# Patient Record
Sex: Female | Born: 1965 | Race: White | Hispanic: No | Marital: Married | Smoking: Never smoker
Health system: Southern US, Community
[De-identification: ages and names within clinical notes are randomized; demographics above are authoritative.]

## PROBLEM LIST (undated history)

## (undated) DIAGNOSIS — Z789 Other specified health status: Secondary | ICD-10-CM

## (undated) DIAGNOSIS — E785 Hyperlipidemia, unspecified: Secondary | ICD-10-CM

## (undated) HISTORY — PX: VAGINAL HYSTERECTOMY: SHX2639

## (undated) HISTORY — PX: KNEE ARTHROSCOPY: SUR90

---

## 1999-07-22 ENCOUNTER — Other Ambulatory Visit: Admission: RE | Admit: 1999-07-22 | Discharge: 1999-07-22 | Payer: Self-pay | Admitting: Obstetrics and Gynecology

## 2000-09-24 ENCOUNTER — Other Ambulatory Visit: Admission: RE | Admit: 2000-09-24 | Discharge: 2000-09-24 | Payer: Self-pay | Admitting: Obstetrics and Gynecology

## 2000-11-02 ENCOUNTER — Encounter: Payer: Self-pay | Admitting: Obstetrics and Gynecology

## 2000-11-02 ENCOUNTER — Encounter: Admission: RE | Admit: 2000-11-02 | Discharge: 2000-11-02 | Payer: Self-pay | Admitting: Obstetrics and Gynecology

## 2001-05-14 ENCOUNTER — Encounter: Payer: Self-pay | Admitting: Obstetrics and Gynecology

## 2001-05-14 ENCOUNTER — Encounter: Admission: RE | Admit: 2001-05-14 | Discharge: 2001-05-14 | Payer: Self-pay | Admitting: Obstetrics and Gynecology

## 2001-11-05 ENCOUNTER — Encounter: Admission: RE | Admit: 2001-11-05 | Discharge: 2001-11-05 | Payer: Self-pay | Admitting: Obstetrics and Gynecology

## 2001-11-05 ENCOUNTER — Encounter: Payer: Self-pay | Admitting: Obstetrics and Gynecology

## 2001-11-25 ENCOUNTER — Other Ambulatory Visit: Admission: RE | Admit: 2001-11-25 | Discharge: 2001-11-25 | Payer: Self-pay | Admitting: Obstetrics and Gynecology

## 2005-11-19 ENCOUNTER — Encounter: Admission: RE | Admit: 2005-11-19 | Discharge: 2005-11-19 | Payer: Self-pay | Admitting: Family Medicine

## 2006-11-23 ENCOUNTER — Encounter: Admission: RE | Admit: 2006-11-23 | Discharge: 2006-11-23 | Payer: Self-pay | Admitting: Family Medicine

## 2007-11-24 ENCOUNTER — Encounter: Admission: RE | Admit: 2007-11-24 | Discharge: 2007-11-24 | Payer: Self-pay | Admitting: Family Medicine

## 2008-11-28 ENCOUNTER — Encounter: Admission: RE | Admit: 2008-11-28 | Discharge: 2008-11-28 | Payer: Self-pay | Admitting: Family Medicine

## 2010-01-17 ENCOUNTER — Encounter: Admission: RE | Admit: 2010-01-17 | Discharge: 2010-01-17 | Payer: Self-pay | Admitting: Family Medicine

## 2010-04-28 ENCOUNTER — Encounter: Payer: Self-pay | Admitting: Family Medicine

## 2011-01-07 ENCOUNTER — Other Ambulatory Visit: Payer: Self-pay | Admitting: Family Medicine

## 2011-01-07 DIAGNOSIS — Z1231 Encounter for screening mammogram for malignant neoplasm of breast: Secondary | ICD-10-CM

## 2011-02-05 ENCOUNTER — Ambulatory Visit
Admission: RE | Admit: 2011-02-05 | Discharge: 2011-02-05 | Disposition: A | Discharge status: Home / Self Care | Payer: BC Managed Care – PPO | Source: Ambulatory Visit | Attending: Family Medicine | Admitting: Family Medicine

## 2011-02-05 DIAGNOSIS — Z1231 Encounter for screening mammogram for malignant neoplasm of breast: Secondary | ICD-10-CM

## 2011-05-05 DIAGNOSIS — K219 Gastro-esophageal reflux disease without esophagitis: Secondary | ICD-10-CM | POA: Insufficient documentation

## 2011-06-30 ENCOUNTER — Other Ambulatory Visit: Payer: Self-pay | Admitting: Obstetrics

## 2011-06-30 DIAGNOSIS — N920 Excessive and frequent menstruation with regular cycle: Secondary | ICD-10-CM

## 2011-07-07 ENCOUNTER — Ambulatory Visit (HOSPITAL_COMMUNITY)
Admission: RE | Admit: 2011-07-07 | Discharge: 2011-07-07 | Disposition: A | Discharge status: Home / Self Care | Payer: BC Managed Care – PPO | Source: Ambulatory Visit | Attending: Obstetrics | Admitting: Obstetrics

## 2011-07-07 DIAGNOSIS — N920 Excessive and frequent menstruation with regular cycle: Secondary | ICD-10-CM | POA: Insufficient documentation

## 2011-09-15 ENCOUNTER — Other Ambulatory Visit: Payer: Self-pay | Admitting: Obstetrics

## 2012-02-03 ENCOUNTER — Other Ambulatory Visit: Payer: Self-pay | Admitting: Family Medicine

## 2012-02-03 DIAGNOSIS — Z1231 Encounter for screening mammogram for malignant neoplasm of breast: Secondary | ICD-10-CM

## 2012-02-25 ENCOUNTER — Ambulatory Visit: Payer: BC Managed Care – PPO

## 2012-08-04 ENCOUNTER — Ambulatory Visit
Admission: RE | Admit: 2012-08-04 | Discharge: 2012-08-04 | Disposition: A | Discharge status: Home / Self Care | Payer: BC Managed Care – PPO | Source: Ambulatory Visit | Attending: Family Medicine | Admitting: Family Medicine

## 2012-08-04 DIAGNOSIS — Z1231 Encounter for screening mammogram for malignant neoplasm of breast: Secondary | ICD-10-CM

## 2013-04-29 DIAGNOSIS — E7800 Pure hypercholesterolemia, unspecified: Secondary | ICD-10-CM | POA: Insufficient documentation

## 2013-08-09 ENCOUNTER — Other Ambulatory Visit: Payer: Self-pay

## 2013-08-09 DIAGNOSIS — Z1231 Encounter for screening mammogram for malignant neoplasm of breast: Secondary | ICD-10-CM

## 2013-09-21 ENCOUNTER — Ambulatory Visit
Admission: RE | Admit: 2013-09-21 | Discharge: 2013-09-21 | Disposition: A | Discharge status: Home / Self Care | Payer: BC Managed Care – PPO | Source: Ambulatory Visit

## 2013-09-21 ENCOUNTER — Encounter (INDEPENDENT_AMBULATORY_CARE_PROVIDER_SITE_OTHER): Payer: Self-pay

## 2013-09-21 DIAGNOSIS — Z1231 Encounter for screening mammogram for malignant neoplasm of breast: Secondary | ICD-10-CM

## 2014-09-20 ENCOUNTER — Other Ambulatory Visit: Payer: Self-pay

## 2014-09-20 DIAGNOSIS — Z1231 Encounter for screening mammogram for malignant neoplasm of breast: Secondary | ICD-10-CM

## 2014-09-29 ENCOUNTER — Ambulatory Visit
Admission: RE | Admit: 2014-09-29 | Discharge: 2014-09-29 | Disposition: A | Discharge status: Home / Self Care | Payer: BC Managed Care – PPO | Source: Ambulatory Visit

## 2014-09-29 DIAGNOSIS — Z1231 Encounter for screening mammogram for malignant neoplasm of breast: Secondary | ICD-10-CM

## 2015-02-02 ENCOUNTER — Encounter (HOSPITAL_COMMUNITY): Payer: Self-pay | Admitting: *Deleted

## 2015-02-02 ENCOUNTER — Inpatient Hospital Stay (HOSPITAL_COMMUNITY)
Admission: AD | Admit: 2015-02-02 | Discharge: 2015-02-03 | Disposition: A | Discharge status: Home / Self Care | Payer: BC Managed Care – PPO | Source: Ambulatory Visit | Attending: Obstetrics and Gynecology | Admitting: Obstetrics and Gynecology

## 2015-02-02 DIAGNOSIS — M7989 Other specified soft tissue disorders: Secondary | ICD-10-CM

## 2015-02-02 DIAGNOSIS — M79603 Pain in arm, unspecified: Secondary | ICD-10-CM | POA: Diagnosis present

## 2015-02-02 HISTORY — DX: Other specified health status: Z78.9

## 2015-02-02 MED ORDER — IBUPROFEN 800 MG PO TABS
800.0000 mg | ORAL_TABLET | Freq: Once | ORAL | Status: AC
  Administered 2015-02-02: 800 mg via ORAL
  Filled 2015-02-02: qty 1

## 2015-02-02 NOTE — MAU Note (Signed)
Had vag hysterectomy Weds morning. IV site R hand is sore and arm swollen. ARm was red and swollen this am. Alittle better now but called RN and told to come in. R arm feels weird all over and getting more flexibility in it now but earlier today hard to use. "Burning" sensation in R arm

## 2015-02-02 NOTE — Discharge Instructions (Signed)
Hysterectomy Information   A hysterectomy is a surgery in which your uterus is removed. This surgery may be done to treat various medical problems. After the surgery, you will no longer have menstrual periods. The surgery will also make you unable to become pregnant (sterile). The fallopian tubes and ovaries can be removed (bilateral salpingo-oophorectomy) during this surgery as well.   REASONS FOR A HYSTERECTOMY  · Persistent, abnormal bleeding.  · Lasting (chronic) pelvic pain or infection.  · The lining of the uterus (endometrium) starts growing outside the uterus (endometriosis).  · The endometrium starts growing in the muscle of the uterus (adenomyosis).  · The uterus falls down into the vagina (pelvic organ prolapse).  · Noncancerous growths in the uterus (uterine fibroids) that cause symptoms.  · Precancerous cells.  · Cervical cancer or uterine cancer.  TYPES OF HYSTERECTOMIES  · Supracervical hysterectomy--In this type, the top part of the uterus is removed, but not the cervix.  · Total hysterectomy--The uterus and cervix are removed.  · Radical hysterectomy--The uterus, the cervix, and the fibrous tissue that holds the uterus in place in the pelvis (parametrium) are removed.  WAYS A HYSTERECTOMY CAN BE PERFORMED  · Abdominal hysterectomy--A large surgical cut (incision) is made in the abdomen. The uterus is removed through this incision.  · Vaginal hysterectomy--An incision is made in the vagina. The uterus is removed through this incision. There are no abdominal incisions.  · Conventional laparoscopic hysterectomy--Three or four small incisions are made in the abdomen. A thin, lighted tube with a camera (laparoscope) is inserted into one of the incisions. Other tools are put through the other incisions. The uterus is cut into small pieces. The small pieces are removed through the incisions, or they are removed through the vagina.  · Laparoscopically assisted vaginal hysterectomy (LAVH)--Three or four  small incisions are made in the abdomen. Part of the surgery is performed laparoscopically and part vaginally. The uterus is removed through the vagina.  · Robot-assisted laparoscopic hysterectomy--A laparoscope and other tools are inserted into 3 or 4 small incisions in the abdomen. A computer-controlled device is used to give the surgeon a 3D image and to help control the surgical instruments. This allows for more precise movements of surgical instruments. The uterus is cut into small pieces and removed through the incisions or removed through the vagina.  RISKS AND COMPLICATIONS   Possible complications associated with this procedure include:  · Bleeding and risk of blood transfusion. Tell your health care provider if you do not want to receive any blood products.  · Blood clots in the legs or lung.  · Infection.  · Injury to surrounding organs.  · Problems or side effects related to anesthesia.  · Conversion to an abdominal hysterectomy from one of the other techniques.  WHAT TO EXPECT AFTER A HYSTERECTOMY  · You will be given pain medicine.  · You will need to have someone with you for the first 3-5 days after you go home.  · You will need to follow up with your surgeon in 2-4 weeks after surgery to evaluate your progress.  · You may have early menopause symptoms such as hot flashes, night sweats, and insomnia.  · If you had a hysterectomy for a problem that was not cancer or not a condition that could lead to cancer, then you no longer need Pap tests. However, even if you no longer need a Pap test, a regular exam is a good idea to make sure no   other problems are starting.     This information is not intended to replace advice given to you by your health care provider. Make sure you discuss any questions you have with your health care provider.     Document Released: 09/17/2000 Document Revised: 01/12/2013 Document Reviewed: 11/29/2012  Elsevier Interactive Patient Education ©2016 Elsevier Inc.

## 2015-02-02 NOTE — MAU Provider Note (Signed)
History     CSN: [id number]  Arrival date and time: 02/02/15 4098   First Provider Initiated Contact with Patient 02/02/15 2350      Chief Complaint  Patient presents with  . Arm Pain   HPI Jaclyn Tate 49 y.o. G1P1001 s/p hysterectomy on 10/26 and resulting swelling of right arm.  Her issue is now virtually resolved.  The site of the IV in her right hand was the source of the swelling.   Throughout the day her fingers and up her arm were swollen and range of motion was some decreased.  She notes her entire arm feels strange up to her shoulder.  She denies fever, pain, weakness, CP, SOB, leg pain.  She was given ibuprofen in MAU prior to seeing provider.  Since then, no further swelling and the other symptoms are all significantly improved to resolved.   OB History    Gravida Para Term Preterm AB TAB SAB Ectopic Multiple Living   Past Medical History  Diagnosis Date  . Medical history non-contributory     Past Surgical History  Procedure Laterality Date  . Cesarean section    . Knee arthroscopy    . Vaginal hysterectomy      Family History  Problem Relation Age of Onset  . Adopted: Yes    Social History  Substance Use Topics  . Smoking status: Never Smoker   . Smokeless tobacco: None  . Alcohol Use: Yes     Comment: social on occ    Allergies: No Known Allergies  Prescriptions prior to admission  Medication Sig Dispense Refill Last Dose  . ibuprofen (ADVIL,MOTRIN) 200 MG tablet Take 600 mg by mouth every 6 (six) hours as needed for moderate pain.   02/02/2015 at Unknown time  . oxyCODONE-acetaminophen (PERCOCET/ROXICET) 5-325 MG tablet Take 1 tablet by mouth every 4 (four) hours as needed for severe pain.   02/01/2015 at Unknown time  . SENNA LEAVES PO Take 1 capsule by mouth 2 (two) times daily.   02/02/2015 at Unknown time    ROS Pertinent ROS in HPI.  All other systems are negative.   Physical Exam   Blood pressure 132/85, pulse 79,  temperature 98.8 F (37.1 C), resp. rate 18, height  (1.575 m), weight 155 lb 12.8 oz (70.67 kg).  Physical Exam  Constitutional: She is oriented to person, place, and time. She appears well-developed and well-nourished. No distress.  HENT:  Head: Normocephalic and atraumatic.  Eyes: Conjunctivae and EOM are normal.  Cardiovascular: Normal rate.   Respiratory: Effort normal. No respiratory distress.  Musculoskeletal: Normal range of motion. She exhibits no edema.  No swelling noted.  Full ROM for fingers and arm.    Neurological: She is alert and oriented to person, place, and time.  Skin: Skin is warm and dry.  Right forearm with 1inch x 3 inch area that is somewhat more pink that surrounding skin.  No warmth or induration appreciated.      MAU Course  Procedures  MDM Pt better prior to provider examination  Assessment and Plan  A:  1. Swelling of arm    P: Discharge to home Regular use of ibuprofen for next couple days as pt recovers from surgery.  Take with food Advised to use Sharpie marker to outline the pink area on her arm to assist with monitoring.  If the pink area turns to red,  becomes hot or progresses beyond the outlined boundaries, then pt should be seen by medical provider again.   F/u with Dr. Rana SnareLowe PRN/as scheduled Patient may return to MAU as needed or if her condition were to change or worsen   Bertram DenverKaren E Teague Clark 02/02/2015, 11:51 PM

## 2015-09-20 ENCOUNTER — Other Ambulatory Visit: Payer: Self-pay | Admitting: Obstetrics and Gynecology

## 2015-09-20 DIAGNOSIS — Z1231 Encounter for screening mammogram for malignant neoplasm of breast: Secondary | ICD-10-CM

## 2015-10-01 ENCOUNTER — Ambulatory Visit
Admission: RE | Admit: 2015-10-01 | Discharge: 2015-10-01 | Disposition: A | Discharge status: Home / Self Care | Payer: BC Managed Care – PPO | Source: Ambulatory Visit | Attending: Obstetrics and Gynecology | Admitting: Obstetrics and Gynecology

## 2015-10-01 DIAGNOSIS — Z1231 Encounter for screening mammogram for malignant neoplasm of breast: Secondary | ICD-10-CM

## 2016-10-01 ENCOUNTER — Other Ambulatory Visit: Payer: Self-pay | Admitting: Obstetrics and Gynecology

## 2016-10-01 DIAGNOSIS — Z1231 Encounter for screening mammogram for malignant neoplasm of breast: Secondary | ICD-10-CM

## 2016-10-02 ENCOUNTER — Ambulatory Visit
Admission: RE | Admit: 2016-10-02 | Discharge: 2016-10-02 | Disposition: A | Discharge status: Home / Self Care | Payer: BC Managed Care – PPO | Source: Ambulatory Visit | Attending: Obstetrics and Gynecology | Admitting: Obstetrics and Gynecology

## 2016-10-02 DIAGNOSIS — Z1231 Encounter for screening mammogram for malignant neoplasm of breast: Secondary | ICD-10-CM

## 2016-10-03 ENCOUNTER — Other Ambulatory Visit: Payer: Self-pay | Admitting: Obstetrics and Gynecology

## 2016-10-03 DIAGNOSIS — R928 Other abnormal and inconclusive findings on diagnostic imaging of breast: Secondary | ICD-10-CM

## 2016-10-07 ENCOUNTER — Ambulatory Visit
Admission: RE | Admit: 2016-10-07 | Discharge: 2016-10-07 | Disposition: A | Discharge status: Home / Self Care | Payer: BC Managed Care – PPO | Source: Ambulatory Visit | Attending: Obstetrics and Gynecology | Admitting: Obstetrics and Gynecology

## 2016-10-07 ENCOUNTER — Ambulatory Visit: Payer: BC Managed Care – PPO

## 2016-10-07 DIAGNOSIS — R928 Other abnormal and inconclusive findings on diagnostic imaging of breast: Secondary | ICD-10-CM

## 2017-09-23 ENCOUNTER — Other Ambulatory Visit: Payer: Self-pay | Admitting: Obstetrics and Gynecology

## 2017-09-23 DIAGNOSIS — Z1231 Encounter for screening mammogram for malignant neoplasm of breast: Secondary | ICD-10-CM

## 2017-10-23 ENCOUNTER — Encounter: Payer: Self-pay | Admitting: Internal Medicine

## 2017-10-29 ENCOUNTER — Ambulatory Visit: Payer: BC Managed Care – PPO

## 2017-12-02 ENCOUNTER — Ambulatory Visit: Payer: BC Managed Care – PPO | Admitting: Internal Medicine

## 2017-12-02 ENCOUNTER — Encounter: Payer: Self-pay | Admitting: Internal Medicine

## 2017-12-02 VITALS — BP 124/80 | HR 80 | Ht 62.0 in | Wt 168.0 lb

## 2017-12-02 DIAGNOSIS — E785 Hyperlipidemia, unspecified: Secondary | ICD-10-CM | POA: Diagnosis not present

## 2017-12-02 MED ORDER — ATORVASTATIN CALCIUM 80 MG PO TABS: 80.0000 mg | ORAL_TABLET | Freq: Every day | ORAL | 11 refills | Status: DC

## 2017-12-02 NOTE — Patient Instructions (Addendum)
Medication Instructions:   START atorvastatin 80mg  daily  Labwork:  FASTING lab work in 3 months to check cholesterol - please have this done prior to your next visit with Dr. Rennis Golden.  Testing/Procedures:  Dr. Rennis Golden has ordered a CT coronary calcium score. This test is done at [address] [address]. This is $150 out of pocket.  Coronary CalciumScan A coronary calcium scan is an imaging test used to look for deposits of calcium and other fatty materials (plaques) in the inner lining of the blood vessels of the heart (coronary arteries). These deposits of calcium and plaques can partly clog and narrow the coronary arteries without producing any symptoms or warning signs. This puts a person at risk for a heart attack. This test can detect these deposits before symptoms develop. Tell a health care provider about:  Any allergies you have.  All medicines you are taking, including vitamins, herbs, eye drops, creams, and over-the-counter medicines.  Any problems you or family members have had with anesthetic medicines.  Any blood disorders you have.  Any surgeries you have had.  Any medical conditions you have.  Whether you are pregnant or may be pregnant. What are the risks? Generally, this is a safe procedure. However, problems may occur, including:  Harm to a pregnant woman and her unborn baby. This test involves the use of radiation. Radiation exposure can be dangerous to a pregnant woman and her unborn baby. If you are pregnant, you generally should not have this procedure done.  Slight increase in the risk of cancer. This is because of the radiation involved in the test. What happens before the procedure? No preparation is needed for this procedure. What happens during the procedure?  You will undress and remove any jewelry around your neck or chest.  You will put on a hospital gown.  Sticky electrodes will be placed on your chest. The electrodes will be connected to  an electrocardiogram (ECG) machine to record a tracing of the electrical activity of your heart.  A CT scanner will take pictures of your heart. During this time, you will be asked to lie still and hold your breath for 2-3 seconds while a picture of your heart is being taken. The procedure may vary among health care providers and hospitals. What happens after the procedure?  You can get dressed.  You can return to your normal activities.  It is up to you to get the results of your test. Ask your health care provider, or the department that is doing the test, when your results will be ready. Summary  A coronary calcium scan is an imaging test used to look for deposits of calcium and other fatty materials (plaques) in the inner lining of the blood vessels of the heart (coronary arteries).  Generally, this is a safe procedure. Tell your health care provider if you are pregnant or may be pregnant.  No preparation is needed for this procedure.  A CT scanner will take pictures of your heart.  You can return to your normal activities after the scan is done. This information is not intended to replace advice given to you by your health care provider. Make sure you discuss any questions you have with your health care provider. Document Released: 09/20/2007 Document Revised: 02/11/2016 Document Reviewed: 02/11/2016 Elsevier Interactive Patient Education  2017 Elsevier Inc.    Follow-Up:  Your physician recommends that you schedule a follow-up appointment in about 3 months with Dr. Rennis Golden for lipid follow up.  If you need a refill on your cardiac medications before your next appointment, please call your pharmacy.  Any Other Special Instructions Will Be Listed Below (If Applicable).

## 2017-12-03 ENCOUNTER — Encounter: Payer: Self-pay | Admitting: Internal Medicine

## 2017-12-03 DIAGNOSIS — E785 Hyperlipidemia, unspecified: Secondary | ICD-10-CM | POA: Insufficient documentation

## 2017-12-03 NOTE — Progress Notes (Signed)
OFFICE CONSULT NOTE  Chief Complaint:  Manage dyslipidemia  Primary Care Physician: Candice Camp, MD  HPI:  Jaclyn Tate is a 52 y.o. female who is being seen today for the evaluation of dyslipidemia at the request of Candice Camp, MD.  This is a pleasant 52 year old female who is referred for evaluation of dyslipidemia.  She had blood work recently through her GYN noting an elevated total cholesterol 270, HDL 65, LDL 181 and triglycerides 161.  In discussion with her she is known that she has had elevated cholesterol at least since her 30s.  For a while she was on a statin medication but says that she did not notice a significant difference in her numbers.  Her PCP essentially gave her an option she fell at the time as to whether or not she wanted to continue medicine or not and therefore she stopped it.  She does not recall the name of the statin that she was taking.  She knows that she was not symptomatic with it.  She is not aware of heart disease in her family as she is adopted, although her cholesterol numbers are very high.  She does have a son who has elevated cholesterol in his 19s and he is in the [employer].  He is not currently on any treatment.  PMHx:  Past Medical History:  Diagnosis Date  . Medical history non-contributory     Past Surgical History:  Procedure Laterality Date  . CESAREAN SECTION    . KNEE ARTHROSCOPY    . VAGINAL HYSTERECTOMY      FAMHx:  Family History  Adopted: Yes    SOCHx:   reports that she has never smoked. She has never used smokeless tobacco. She reports that she drinks alcohol. She reports that she does not use drugs.  ALLERGIES:  No Known Allergies  ROS: Pertinent items noted in HPI and remainder of comprehensive ROS otherwise negative.  HOME MEDS: No current outpatient medications on file prior to visit.   No current facility-administered medications on file prior to visit.     LABS/IMAGING: No results found for this or any  previous visit (from the past 48 hour(s)). No results found.  LIPID PANEL: No results found for: CHOL, TRIG, HDL, CHOLHDL, VLDL, LDLCALC, LDLDIRECT  WEIGHTS: Wt Readings from Last 3 Encounters:  12/02/17 168 lb (76.2 kg)  02/02/15 155 lb 12.8 oz (70.7 kg)    VITALS: BP 124/80 (BP Location: Right Arm, Patient Position: Sitting, Cuff Size: Normal)   Pulse 80   Ht 5\' 2"  (1.575 m)   Wt 168 lb (76.2 kg)   BMI 30.73 kg/m   EXAM: General appearance: alert and no distress Neck: no carotid bruit, no JVD and thyroid not enlarged, symmetric, no tenderness/mass/nodules Lungs: clear to auscultation bilaterally Heart: regular rate and rhythm, S1, S2 normal, no murmur, click, rub or gallop Abdomen: soft, non-tender; bowel sounds normal; no masses,  no organomegaly Extremities: extremities normal, atraumatic, no cyanosis or edema and No tendinous xanthomas Pulses: 2+ and symmetric Skin: Skin color, texture, turgor normal. No rashes or lesions Neurologic: Grossly normal Psych: Pleasant  EKG: Deferred  ASSESSMENT: 1. Marked dyslipidemia, primarily high LDL 2. Possible FH (patient is adopted)  PLAN: 1.   Jaclyn Tate has marked dyslipidemia and her family history is essentially unknown.  Her son in his 32s also has elevated cholesterol and could suggest this is a familial hyperlipidemia.  Have encouraged him to continue to have screening and treatment according to  current guidelines.  For her we would recommend at least 50% reduction in cholesterol.  In addition it would be helpful to identify whether she has any premature onset coronary disease.  I am recommending a coronary artery calcium score.  This would help us further risk stratify her.  We will go ahead and start high-dose rosuvastatin 40 mg daily.  Hopefully she will tolerate this without myalgias and plan a repeat lipid profile in 3 months.  We can further adjust her therapy based on her calcium score.  Thanks again for the kind  referral.  Chrystie NoseKenneth C. Jelan Batterton, MD, [facility] Medical CenterFACC, FACP  [place]  [facility]  Medical Director of the Advanced Lipid Disorders &  Cardiovascular Risk Reduction Clinic Diplomate of the American Board of Clinical Lipidology Attending Cardiologist  Direct Dial: [phone]  Fax: [phone]  Website:  [email or web address]  Durelle Zepeda C Gerome Kokesh 12/03/2017, 1:59 PM

## 2017-12-17 ENCOUNTER — Ambulatory Visit (INDEPENDENT_AMBULATORY_CARE_PROVIDER_SITE_OTHER)
Admission: RE | Admit: 2017-12-17 | Discharge: 2017-12-17 | Disposition: A | Discharge status: Home / Self Care | Payer: Self-pay | Source: Ambulatory Visit | Attending: Internal Medicine | Admitting: Internal Medicine

## 2017-12-17 DIAGNOSIS — E785 Hyperlipidemia, unspecified: Secondary | ICD-10-CM

## 2018-02-18 ENCOUNTER — Encounter: Payer: Self-pay | Admitting: *Deleted

## 2018-02-26 LAB — LIPID PANEL
Chol/HDL Ratio: 2.4 ratio (ref 0.0–4.4)
Cholesterol, Total: 144 mg/dL (ref 100–199)
HDL: 59 mg/dL (ref >39)
LDL Calculated: 76 mg/dL (ref 0–99)
Triglycerides: 45 mg/dL (ref 0–149)
VLDL CHOLESTEROL CAL: 9 mg/dL (ref 5–40)

## 2018-03-01 ENCOUNTER — Ambulatory Visit: Payer: BC Managed Care – PPO | Admitting: Internal Medicine

## 2018-03-01 ENCOUNTER — Encounter: Payer: Self-pay | Admitting: Internal Medicine

## 2018-03-01 VITALS — BP 120/82 | HR 87 | Ht 62.0 in | Wt 172.6 lb

## 2018-03-01 DIAGNOSIS — E785 Hyperlipidemia, unspecified: Secondary | ICD-10-CM | POA: Diagnosis not present

## 2018-03-01 NOTE — Progress Notes (Signed)
OFFICE CONSULT NOTE  Chief Complaint:  Follow-up dyslipidemia  Primary Care Physician: Candice CampLowe, David, MD  HPI:  Jaclyn Tate is a 52 y.o. female who is being seen today for the evaluation of dyslipidemia at the request of Candice CampLowe, David, MD.  This is a pleasant 52 year old female who is referred for evaluation of dyslipidemia.  She had blood work recently through her GYN noting an elevated total cholesterol 270, HDL 65, LDL 181 and triglycerides 454116.  In discussion with her she is known that she has had elevated cholesterol at least since her 30s.  For a while she was on a statin medication but says that she did not notice a significant difference in her numbers.  Her PCP essentially gave her an option she fell at the time as to whether or not she wanted to continue medicine or not and therefore she stopped it.  She does not recall the name of the statin that she was taking.  She knows that she was not symptomatic with it.  She is not aware of heart disease in her family as she is adopted, although her cholesterol numbers are very high.  She does have a son who has elevated cholesterol in his 4820s and he is in the [employer].  He is not currently on any treatment.  03/01/2018  Jaclyn Tate is returns today for follow-up of dyslipidemia.  Overall she is doing well denies any side effects with the statin.  Her cholesterol profile is improved significantly with total cholesterol 144, triglycerides 45, HDL 59 and LDL of 76.  Fortunately she also had a 0 coronary calcium score without any extracardiac findings.  Based on this there is some body of literature suggesting that she may not need therapy however she does meet criteria for 50% reduction in LDL cholesterol based on her LDL of 181.  At this point she has had more than 50% reduction which also suggest dietary involvement.  Overall I think this current lipid profile indicates a very low risk.  PMHx:  Past Medical History:  Diagnosis Date  . Medical  history non-contributory     Past Surgical History:  Procedure Laterality Date  . CESAREAN SECTION    . KNEE ARTHROSCOPY    . VAGINAL HYSTERECTOMY      FAMHx:  Family History  Adopted: Yes  Family history unknown: Yes    SOCHx:   reports that she has never smoked. She has never used smokeless tobacco. She reports that she drinks alcohol. She reports that she does not use drugs.  ALLERGIES:  No Known Allergies  ROS: Pertinent items noted in HPI and remainder of comprehensive ROS otherwise negative.  HOME MEDS: Current Outpatient Medications on File Prior to Visit  Medication Sig Dispense Refill  . atorvastatin (LIPITOR) 80 MG tablet Take 1 tablet (80 mg total) by mouth daily. 30 tablet 11   No current facility-administered medications on file prior to visit.     LABS/IMAGING: No results found for this or any previous visit (from the past 48 hour(s)). No results found.  LIPID PANEL:    Component Value Date/Time   CHOL 144 02/25/2018 0801   TRIG 45 02/25/2018 0801   HDL 59 02/25/2018 0801   CHOLHDL 2.4 02/25/2018 0801   LDLCALC 76 02/25/2018 0801    WEIGHTS: Wt Readings from Last 3 Encounters:  03/01/18 172 lb 9.6 oz (78.3 kg)  12/02/17 168 lb (76.2 kg)  02/02/15 155 lb 12.8 oz (70.7 kg)  VITALS: BP 120/82   Pulse 87   Ht 5\' 2"  (1.575 m)   Wt 172 lb 9.6 oz (78.3 kg)   SpO2 99%   BMI 31.57 kg/m   EXAM: Deferred  EKG: Deferred  ASSESSMENT: 1. Marked dyslipidemia, primarily high LDL 2. Possible FH (patient is adopted) 3. 0 coronary artery calcium (2019)  PLAN: 1.   Jaclyn Tate has had significant improvement in her dyslipidemia with a combination of both diet and statin therapy.  She is tolerating it well without side effects.  This will likely be long-term therapy for her.  Fortunately she had no coronary artery calcification.  However given her elevated lipid levels without treatment she would be at risk for developing coronary disease  long-term.  I would recommend continued long-term therapy with statins and can follow-up with her in a year or sooner as necessary.  Chrystie Nose, MD, [facility], FACP  [place]  [facility]  Medical Director of the Advanced Lipid Disorders &  Cardiovascular Risk Reduction Clinic Diplomate of the American Board of Clinical Lipidology Attending Cardiologist  Direct Dial: [phone]  Fax: [phone]  Website:  [email or web address] 03/01/2018, 8:33 AM

## 2018-03-01 NOTE — Patient Instructions (Signed)

## 2018-12-13 ENCOUNTER — Other Ambulatory Visit: Payer: Self-pay | Admitting: Internal Medicine

## 2019-03-07 ENCOUNTER — Ambulatory Visit: Payer: BC Managed Care – PPO | Admitting: Internal Medicine

## 2019-03-07 ENCOUNTER — Encounter: Payer: Self-pay | Admitting: Internal Medicine

## 2019-03-07 ENCOUNTER — Other Ambulatory Visit: Payer: Self-pay

## 2019-03-07 VITALS — BP 115/81 | HR 79 | Temp 97.1°F | Ht 62.0 in | Wt 179.8 lb

## 2019-03-07 DIAGNOSIS — E785 Hyperlipidemia, unspecified: Secondary | ICD-10-CM

## 2019-03-07 DIAGNOSIS — N951 Menopausal and female climacteric states: Secondary | ICD-10-CM | POA: Diagnosis not present

## 2019-03-07 DIAGNOSIS — Z7189 Other specified counseling: Secondary | ICD-10-CM | POA: Diagnosis not present

## 2019-03-07 LAB — LIPID PANEL
Chol/HDL Ratio: 2.6 ratio (ref 0.0–4.4)
Cholesterol, Total: 162 mg/dL (ref 100–199)
HDL: 62 mg/dL (ref >39)
LDL Chol Calc (NIH): 71 mg/dL (ref 0–99)
Triglycerides: 175 mg/dL — ABNORMAL HIGH (ref 0–149)
VLDL Cholesterol Cal: 29 mg/dL (ref 5–40)

## 2019-03-07 MED ORDER — ATORVASTATIN CALCIUM 80 MG PO TABS: 80.0000 mg | ORAL_TABLET | Freq: Every day | ORAL | 3 refills | Status: DC

## 2019-03-07 NOTE — Patient Instructions (Signed)
Medication Instructions:  Your physician recommends that you continue on your current medications as directed. Please refer to the Current Medication list given to you today.  *If you need a refill on your cardiac medications before your next appointment, please call your pharmacy*  Lab Work: LIPID PANEL If you have labs (blood work) drawn today and your tests are completely normal, you will receive your results only by: Marland Kitchen MyChart Message (if you have MyChart) OR . A paper copy in the mail If you have any lab test that is abnormal or we need to change your treatment, we will call you to review the results.  Testing/Procedures: NONE  Follow-Up: At [facility], you and your health needs are our priority.  As part of our continuing mission to provide you with exceptional heart care, we have created designated Provider Care Teams.  These Care Teams include your primary Cardiologist (physician) and Advanced Practice Providers (APPs -  Physician Assistants and Nurse Practitioners) who all work together to provide you with the care you need, when you need it.  Your next appointment:   12 month(s)  The format for your next appointment:   Either In Person or Virtual  Provider:   Raliegh Ip Mali Hilty, MD  Other Instructions

## 2019-03-07 NOTE — Progress Notes (Signed)
OFFICE CONSULT NOTE  Chief Complaint:  Follow-up dyslipidemia  Primary Care Physician: Candice Camp, MD  HPI:  Jaclyn Tate is a 53 y.o. female who is being seen today for the evaluation of dyslipidemia at the request of Candice Camp, MD.  This is a pleasant 53 year old female who is referred for evaluation of dyslipidemia.  She had blood work recently through her GYN noting an elevated total cholesterol 270, HDL 65, LDL 181 and triglycerides 947.  In discussion with her she is known that she has had elevated cholesterol at least since her 30s.  For a while she was on a statin medication but says that she did not notice a significant difference in her numbers.  Her PCP essentially gave her an option she fell at the time as to whether or not she wanted to continue medicine or not and therefore she stopped it.  She does not recall the name of the statin that she was taking.  She knows that she was not symptomatic with it.  She is not aware of heart disease in her family as she is adopted, although her cholesterol numbers are very high.  She does have a son who has elevated cholesterol in his 35s and he is in the [employer].  He is not currently on any treatment.  03/01/2018  Jaclyn Tate is returns today for follow-up of dyslipidemia.  Overall she is doing well denies any side effects with the statin.  Her cholesterol profile is improved significantly with total cholesterol 144, triglycerides 45, HDL 59 and LDL of 76.  Fortunately she also had a 0 coronary calcium score without any extracardiac findings.  Based on this there is some body of literature suggesting that she may not need therapy however she does meet criteria for 50% reduction in LDL cholesterol based on her LDL of 181.  At this point she has had more than 50% reduction which also suggest dietary involvement.  Overall I think this current lipid profile indicates a very low risk.  03/07/2019  Jaclyn Tate returns for follow-up today. She is  without complaints. She recently started on estrogen supplementation for peri-menopausal symptoms and is much improved. She denies any chest pain or dyspnea. No side-effects from Lipitor. She is due for repeat lipid testing.  PMHx:  Past Medical History:  Diagnosis Date  . Medical history non-contributory     Past Surgical History:  Procedure Laterality Date  . CESAREAN SECTION    . KNEE ARTHROSCOPY    . VAGINAL HYSTERECTOMY      FAMHx:  Family History  Adopted: Yes  Family history unknown: Yes    SOCHx:   reports that she has never smoked. She has never used smokeless tobacco. She reports current alcohol use. She reports that she does not use drugs.  ALLERGIES:  No Known Allergies  ROS: Pertinent items noted in HPI and remainder of comprehensive ROS otherwise negative.  HOME MEDS: Current Outpatient Medications on File Prior to Visit  Medication Sig Dispense Refill  . atorvastatin (LIPITOR) 80 MG tablet TAKE 1 TABLET BY MOUTH EVERY DAY 90 tablet 0  . estradiol (ESTRACE) 2 MG tablet Take 1 tablet by mouth daily.     No current facility-administered medications on file prior to visit.     LABS/IMAGING: No results found for this or any previous visit (from the past 48 hour(s)). No results found.  LIPID PANEL:    Component Value Date/Time   CHOL 144 02/25/2018 0801   TRIG  45 02/25/2018 0801   HDL 59 02/25/2018 0801   CHOLHDL 2.4 02/25/2018 0801   LDLCALC 76 02/25/2018 0801    WEIGHTS: Wt Readings from Last 3 Encounters:  03/07/19 179 lb 12.8 oz (81.6 kg)  03/01/18 172 lb 9.6 oz (78.3 kg)  12/02/17 168 lb (76.2 kg)    VITALS: BP 115/81   Pulse 79   Temp (!) 97.1 F (36.2 C)   Ht 5\' 2"  (1.575 m)   Wt 179 lb 12.8 oz (81.6 kg)   SpO2 96%   BMI 32.89 kg/m   EXAM: General appearance: alert and no distress Neck: no carotid bruit, no JVD and thyroid not enlarged, symmetric, no tenderness/mass/nodules Lungs: clear to auscultation bilaterally Heart:  regular rate and rhythm Abdomen: soft, non-tender; bowel sounds normal; no masses,  no organomegaly Extremities: extremities normal, atraumatic, no cyanosis or edema Pulses: 2+ and symmetric Skin: Skin color, texture, turgor normal. No rashes or lesions Neurologic: Grossly normal Psych: Pleasant  EKG: Deferred  ASSESSMENT: 1. Marked dyslipidemia, primarily high LDL 2. Possible FH (patient is adopted) 3. 0 coronary artery calcium (2019)  PLAN: 1.   Jaclyn Tate is doing well on atorvastatin.  Her LDL last year was 9.  She is due for repeat lipid.  She is asymptomatic.  Recently she was started on estrogen for hot flashes.  This could possibly affect her cholesterol and may raise it somewhat.  I would like to see if there is been any change in that.  Otherwise, plan follow-up annually or sooner as necessary.  Jaclyn Casino, MD, [facility], [place] Director of the Advanced Lipid Disorders &  Cardiovascular Risk Reduction Clinic Diplomate of the American Board of Clinical Lipidology Attending Cardiologist  Direct Dial: [phone]  Fax: [phone]  Website:  [email or web address] 03/07/2019, 9:27 AM

## 2019-03-09 ENCOUNTER — Encounter: Payer: Self-pay | Admitting: Internal Medicine

## 2019-03-25 ENCOUNTER — Other Ambulatory Visit: Payer: Self-pay | Admitting: Internal Medicine

## 2020-01-05 ENCOUNTER — Other Ambulatory Visit: Payer: Self-pay | Admitting: Internal Medicine

## 2020-01-05 ENCOUNTER — Other Ambulatory Visit: Payer: Self-pay | Admitting: *Deleted

## 2020-01-05 DIAGNOSIS — E785 Hyperlipidemia, unspecified: Secondary | ICD-10-CM

## 2020-02-16 LAB — LIPID PANEL
Chol/HDL Ratio: 3 ratio (ref 0.0–4.4)
Cholesterol, Total: 166 mg/dL (ref 100–199)
HDL: 55 mg/dL (ref >39)
LDL Chol Calc (NIH): 90 mg/dL (ref 0–99)
Triglycerides: 115 mg/dL (ref 0–149)
VLDL Cholesterol Cal: 21 mg/dL (ref 5–40)

## 2020-02-17 ENCOUNTER — Encounter: Payer: Self-pay | Admitting: Internal Medicine

## 2020-03-06 ENCOUNTER — Ambulatory Visit: Payer: BC Managed Care – PPO | Admitting: Internal Medicine

## 2020-03-08 ENCOUNTER — Encounter: Payer: Self-pay | Admitting: Internal Medicine

## 2020-03-08 ENCOUNTER — Ambulatory Visit: Payer: BC Managed Care – PPO | Admitting: Internal Medicine

## 2020-03-08 ENCOUNTER — Other Ambulatory Visit: Payer: Self-pay

## 2020-03-08 VITALS — BP 120/80 | HR 78 | Ht 62.0 in | Wt 181.0 lb

## 2020-03-08 DIAGNOSIS — E785 Hyperlipidemia, unspecified: Secondary | ICD-10-CM | POA: Diagnosis not present

## 2020-03-08 DIAGNOSIS — Z7189 Other specified counseling: Secondary | ICD-10-CM | POA: Diagnosis not present

## 2020-03-08 NOTE — Progress Notes (Signed)
OFFICE CONSULT NOTE  Chief Complaint:  Follow-up dyslipidemia  Primary Care Physician: Candice Camp, MD  HPI:  Jaclyn Tate is a 54 y.o. female who is being seen today for the evaluation of dyslipidemia at the request of Candice Camp, MD.  This is a pleasant 54 year old female who is referred for evaluation of dyslipidemia.  She had blood work recently through her GYN noting an elevated total cholesterol 270, HDL 65, LDL 181 and triglycerides 962.  In discussion with her she is known that she has had elevated cholesterol at least since her 30s.  For a while she was on a statin medication but says that she did not notice a significant difference in her numbers.  Her PCP essentially gave her an option she fell at the time as to whether or not she wanted to continue medicine or not and therefore she stopped it.  She does not recall the name of the statin that she was taking.  She knows that she was not symptomatic with it.  She is not aware of heart disease in her family as she is adopted, although her cholesterol numbers are very high.  She does have a son who has elevated cholesterol in his 54s and he is in the [employer].  He is not currently on any treatment.  03/01/2018  Jaclyn Tate is returns today for follow-up of dyslipidemia.  Overall she is doing well denies any side effects with the statin.  Her cholesterol profile is improved significantly with total cholesterol 144, triglycerides 45, HDL 59 and LDL of 76.  Fortunately she also had a 0 coronary calcium score without any extracardiac findings.  Based on this there is some body of literature suggesting that she may not need therapy however she does meet criteria for 50% reduction in LDL cholesterol based on her LDL of 181.  At this point she has had more than 50% reduction which also suggest dietary involvement.  Overall I think this current lipid profile indicates a very low risk.  03/07/2019  Jaclyn Tate returns for follow-up today. She  is without complaints. She recently started on estrogen supplementation for peri-menopausal symptoms and is much improved. She denies any chest pain or dyspnea. No side-effects from Lipitor. She is due for repeat lipid testing.  03/08/2020  Jaclyn Tate is seen today in follow-up.  Overall she is doing well.  Her cholesterol is improved significantly from the Lipitor.  Her labs 3 weeks ago showed total cholesterol 166, triglycerides 115, HDL 55 and LDL of 90.  Her goal LDL is less than 100.  PMHx:  Past Medical History:  Diagnosis Date  . Medical history non-contributory     Past Surgical History:  Procedure Laterality Date  . CESAREAN SECTION    . KNEE ARTHROSCOPY    . VAGINAL HYSTERECTOMY      FAMHx:  Family History  Adopted: Yes  Family history unknown: Yes    SOCHx:   reports that she has never smoked. She has never used smokeless tobacco. She reports current alcohol use. She reports that she does not use drugs.  ALLERGIES:  No Known Allergies  ROS: Pertinent items noted in HPI and remainder of comprehensive ROS otherwise negative.  HOME MEDS: Current Outpatient Medications on File Prior to Visit  Medication Sig Dispense Refill  . atorvastatin (LIPITOR) 80 MG tablet TAKE 1 TABLET BY MOUTH EVERY DAY 90 tablet 3  . estradiol (ESTRACE) 2 MG tablet Take 1 tablet by mouth daily.  No current facility-administered medications on file prior to visit.    LABS/IMAGING: No results found for this or any previous visit (from the past 48 hour(s)). No results found.  LIPID PANEL:    Component Value Date/Time   CHOL 166 02/16/2020 0848   TRIG 115 02/16/2020 0848   HDL 55 02/16/2020 0848   CHOLHDL 3.0 02/16/2020 0848   LDLCALC 90 02/16/2020 0848    WEIGHTS: Wt Readings from Last 3 Encounters:  03/08/20 181 lb (82.1 kg)  03/07/19 179 lb 12.8 oz (81.6 kg)  03/01/18 172 lb 9.6 oz (78.3 kg)    VITALS: BP 120/80   Pulse 78   Ht 5\' 2"  (1.575 m)   Wt 181 lb (82.1 kg)    SpO2 98%   BMI 33.11 kg/m   EXAM: Deferred  EKG: Deferred  ASSESSMENT: 1. Marked dyslipidemia, primarily high LDL 2. Possible FH (patient is adopted) 3. 0 coronary artery calcium (2019)  PLAN: 1.   Jaclyn Tate has had significant reduction in her lipids on high potency atorvastatin.  She had no coronary calcium in 2019.  I recommend she continue on the current medication.  She is tolerating very well.  We will plan repeat lipids in a year and follow-up with me at that time  2020, MD, [facility], FACP  [place]  [facility]  Medical Director of the Advanced Lipid Disorders &  Cardiovascular Risk Reduction Clinic Diplomate of the American Board of Clinical Lipidology Attending Cardiologist  Direct Dial: [phone]  Fax: [phone]  Website:  [email or web address] Myan Suit 03/08/2020, 9:19 AM

## 2020-03-08 NOTE — Patient Instructions (Signed)
Medication Instructions:  Your physician recommends that you continue on your current medications as directed. Please refer to the Current Medication list given to you today.  *If you need a refill on your cardiac medications before your next appointment, please call your pharmacy*   Lab Work: FASTING lipid panel in 1 year visit  If you have labs (blood work) drawn today and your tests are completely normal, you will receive your results only by:  MyChart Message (if you have MyChart) OR  A paper copy in the mail If you have any lab test that is abnormal or we need to change your treatment, we will call you to review the results.   Testing/Procedures: NONE   Follow-Up: At [facility], you and your health needs are our priority.  As part of our continuing mission to provide you with exceptional heart care, we have created designated Provider Care Teams.  These Care Teams include your primary Cardiologist (physician) and Advanced Practice Providers (APPs -  Physician Assistants and Nurse Practitioners) who all work together to provide you with the care you need, when you need it.  We recommend signing up for the patient portal called "MyChart".  Sign up information is provided on this After Visit Summary.  MyChart is used to connect with patients for Virtual Visits (Telemedicine).  Patients are able to view lab/test results, encounter notes, upcoming appointments, etc.  Non-urgent messages can be sent to your provider as well.   To learn more about what you can do with MyChart, go to [email or web address].    Your next appointment:   12 month(s)  The format for your next appointment:   In Person  Provider:    Dr. Zoila Shutter - lipid clinic    Other Instructions

## 2020-03-20 ENCOUNTER — Other Ambulatory Visit: Payer: Self-pay | Admitting: Internal Medicine

## 2020-03-21 NOTE — Telephone Encounter (Signed)
Rx has been sent to the pharmacy electronically. ° °

## 2020-05-08 ENCOUNTER — Other Ambulatory Visit: Payer: Self-pay | Admitting: Obstetrics and Gynecology

## 2020-05-08 DIAGNOSIS — R928 Other abnormal and inconclusive findings on diagnostic imaging of breast: Secondary | ICD-10-CM

## 2020-05-22 ENCOUNTER — Other Ambulatory Visit: Payer: Self-pay

## 2020-05-22 ENCOUNTER — Ambulatory Visit: Payer: BC Managed Care – PPO

## 2020-05-22 ENCOUNTER — Ambulatory Visit
Admission: RE | Admit: 2020-05-22 | Discharge: 2020-05-22 | Disposition: A | Discharge status: Home / Self Care | Payer: BC Managed Care – PPO | Source: Ambulatory Visit | Attending: Obstetrics and Gynecology | Admitting: Obstetrics and Gynecology

## 2020-05-22 DIAGNOSIS — R928 Other abnormal and inconclusive findings on diagnostic imaging of breast: Secondary | ICD-10-CM

## 2020-05-23 ENCOUNTER — Telehealth: Payer: BC Managed Care – PPO | Admitting: Physician Assistant

## 2020-05-23 DIAGNOSIS — R300 Dysuria: Secondary | ICD-10-CM

## 2020-05-23 MED ORDER — CEPHALEXIN 500 MG PO CAPS: 500.0000 mg | ORAL_CAPSULE | Freq: Two times a day (BID) | ORAL | 0 refills | Status: AC

## 2020-05-23 NOTE — Progress Notes (Signed)

## 2020-06-27 ENCOUNTER — Other Ambulatory Visit: Payer: Self-pay | Admitting: Internal Medicine

## 2020-11-06 ENCOUNTER — Telehealth: Payer: Self-pay | Admitting: Internal Medicine

## 2020-11-06 NOTE — Telephone Encounter (Signed)
Jaclyn Tate called to schedule her 1 year lipid f/u with Dr. Rennis Golden. After being scheduled she stated she normally gets a letter in the mail with the information on what labs she will need performed prior to her appointment so she can take it to [facility] to have it done. Please advise.

## 2020-11-06 NOTE — Telephone Encounter (Signed)
Attempted to call patient, left message for patient to call back to office.   

## 2020-11-07 ENCOUNTER — Other Ambulatory Visit: Payer: Self-pay | Admitting: *Deleted

## 2020-11-07 DIAGNOSIS — E785 Hyperlipidemia, unspecified: Secondary | ICD-10-CM

## 2020-11-07 DIAGNOSIS — Z7189 Other specified counseling: Secondary | ICD-10-CM

## 2020-11-07 NOTE — Telephone Encounter (Signed)
Left message for patient lab orders have been placed in the mail.

## 2021-02-12 LAB — LIPID PANEL
Chol/HDL Ratio: 3.1 ratio (ref 0.0–4.4)
Cholesterol, Total: 172 mg/dL (ref 100–199)
HDL: 55 mg/dL (ref >39)
LDL Chol Calc (NIH): 91 mg/dL (ref 0–99)
Triglycerides: 149 mg/dL (ref 0–149)
VLDL Cholesterol Cal: 26 mg/dL (ref 5–40)

## 2021-03-21 ENCOUNTER — Ambulatory Visit (HOSPITAL_BASED_OUTPATIENT_CLINIC_OR_DEPARTMENT_OTHER): Payer: BC Managed Care – PPO | Admitting: Internal Medicine

## 2021-03-21 ENCOUNTER — Encounter (HOSPITAL_BASED_OUTPATIENT_CLINIC_OR_DEPARTMENT_OTHER): Payer: Self-pay | Admitting: Internal Medicine

## 2021-03-21 ENCOUNTER — Other Ambulatory Visit: Payer: Self-pay

## 2021-03-21 VITALS — BP 118/76 | HR 91 | Ht 61.5 in | Wt 169.6 lb

## 2021-03-21 DIAGNOSIS — E785 Hyperlipidemia, unspecified: Secondary | ICD-10-CM

## 2021-03-21 DIAGNOSIS — Z7189 Other specified counseling: Secondary | ICD-10-CM | POA: Diagnosis not present

## 2021-03-21 NOTE — Progress Notes (Signed)
OFFICE CONSULT NOTE  Chief Complaint:  Follow-up dyslipidemia  Primary Care Physician: Verlee Rossetti, PA-C  HPI:  Jaclyn Tate is a 55 y.o. female who is being seen today for the evaluation of dyslipidemia at the request of Candice Camp, MD.  This is a pleasant 55 year old female who is referred for evaluation of dyslipidemia.  She had blood work recently through her GYN noting an elevated total cholesterol 270, HDL 65, LDL 181 and triglycerides 213.  In discussion with her she is known that she has had elevated cholesterol at least since her 30s.  For a while she was on a statin medication but says that she did not notice a significant difference in her numbers.  Her PCP essentially gave her an option she fell at the time as to whether or not she wanted to continue medicine or not and therefore she stopped it.  She does not recall the name of the statin that she was taking.  She knows that she was not symptomatic with it.  She is not aware of heart disease in her family as she is adopted, although her cholesterol numbers are very high.  She does have a son who has elevated cholesterol in his 74s and he is in the [employer].  He is not currently on any treatment.  03/01/2018  [name] is returns today for follow-up of dyslipidemia.  Overall she is doing well denies any side effects with the statin.  Her cholesterol profile is improved significantly with total cholesterol 144, triglycerides 45, HDL 59 and LDL of 76.  Fortunately she also had a 0 coronary calcium score without any extracardiac findings.  Based on this there is some body of literature suggesting that she may not need therapy however she does meet criteria for 50% reduction in LDL cholesterol based on her LDL of 181.  At this point she has had more than 50% reduction which also suggest dietary involvement.  Overall I think this current lipid profile indicates a very low risk.  03/07/2019  [name] returns for follow-up  today. She is without complaints. She recently started on estrogen supplementation for peri-menopausal symptoms and is much improved. She denies any chest pain or dyspnea. No side-effects from Lipitor. She is due for repeat lipid testing.  03/08/2020  [name] is seen today in follow-up.  Overall she is doing well.  Her cholesterol is improved significantly from the Lipitor.  Her labs 3 weeks ago showed total cholesterol 166, triglycerides 115, HDL 55 and LDL of 90.  Her goal LDL is less than 100.  03/21/2021  Mr. Hartsough returns today for follow-up.  She continues to do well.  Her cholesterols been very stable.  Repeat lipids show total cholesterol of 172, HDL 55, triglycerides 149 and LDL 91.  Have encouraged her to continue with diet and exercise and work to try to lower the weight somewhat.  She says she is about to go on 1 week cruise to the [place].  PMHx:  Past Medical History:  Diagnosis Date   Medical history non-contributory     Past Surgical History:  Procedure Laterality Date   CESAREAN SECTION     KNEE ARTHROSCOPY     VAGINAL HYSTERECTOMY      FAMHx:  Family History  Adopted: Yes  Family history unknown: Yes    SOCHx:   reports that she has never smoked. She has never used smokeless tobacco. She reports current alcohol use. She reports that she  does not use drugs.  ALLERGIES:  No Known Allergies  ROS: Pertinent items noted in HPI and remainder of comprehensive ROS otherwise negative.  HOME MEDS: Current Outpatient Medications on File Prior to Visit  Medication Sig Dispense Refill   ALPRAZolam (XANAX) 0.25 MG tablet Take 0.25 mg by mouth 3 (three) times daily as needed.     atorvastatin (LIPITOR) 80 MG tablet TAKE 1 TABLET BY MOUTH EVERY DAY 90 tablet 3   Cholecalciferol 25 MCG (1000 UT) tablet Take by mouth.     estradiol (ESTRACE) 2 MG tablet Take 1 tablet by mouth daily.     No current facility-administered medications on file prior to visit.     LABS/IMAGING: No results found for this or any previous visit (from the past 48 hour(s)). No results found.  LIPID PANEL:    Component Value Date/Time   CHOL 172 02/11/2021 0852   TRIG 149 02/11/2021 0852   HDL 55 02/11/2021 0852   CHOLHDL 3.1 02/11/2021 0852   LDLCALC 91 02/11/2021 0852    WEIGHTS: Wt Readings from Last 3 Encounters:  03/21/21 169 lb 9.6 oz (76.9 kg)  03/08/20 181 lb (82.1 kg)  03/07/19 179 lb 12.8 oz (81.6 kg)    VITALS: BP 118/76    Pulse 91    Ht 5' 1.5" (1.562 m)    Wt 169 lb 9.6 oz (76.9 kg)    SpO2 97%    BMI 31.53 kg/m   EXAM: Deferred  EKG: Deferred  ASSESSMENT: Marked dyslipidemia, primarily high LDL Possible FH (patient is adopted) 0 coronary artery calcium (2019)  PLAN: 1.   Ms. Walter continues to do well on high intensity atorvastatin.  She has no coronary calcium based on her 2019 scan.  Would not recommend repeating that for at least 7 to 10 years.  Overall I think she is at target on therapy.  Continue her current statin dose.  Plan follow-up with me annually or sooner as necessary.  Chrystie Nose, MD, [facility], FACP  [place]   [facility]  Medical Director of the Advanced Lipid Disorders &  Cardiovascular Risk Reduction Clinic Diplomate of the American Board of Clinical Lipidology Attending Cardiologist  Direct Dial: [phone]   Fax: [phone]  Website:  [email or web address] [place] 03/21/2021, 8:51 AM

## 2021-03-21 NOTE — Patient Instructions (Signed)
Medication Instructions:  Your physician recommends that you continue on your current medications as directed. Please refer to the Current Medication list given to you today.  *If you need a refill on your cardiac medications before your next appointment, please call your pharmacy*   Lab Work: FASTING lipid panel in 1 year to check cholesterol   If you have labs (blood work) drawn today and your tests are completely normal, you will receive your results only by: MyChart Message (if you have MyChart) OR A paper copy in the mail If you have any lab test that is abnormal or we need to change your treatment, we will call you to review the results.  Follow-Up: At [facility], you and your health needs are our priority.  As part of our continuing mission to provide you with exceptional heart care, we have created designated Provider Care Teams.  These Care Teams include your primary Cardiologist (physician) and Advanced Practice Providers (APPs -  Physician Assistants and Nurse Practitioners) who all work together to provide you with the care you need, when you need it.  We recommend signing up for the patient portal called "MyChart".  Sign up information is provided on this After Visit Summary.  MyChart is used to connect with patients for Virtual Visits (Telemedicine).  Patients are able to view lab/test results, encounter notes, upcoming appointments, etc.  Non-urgent messages can be sent to your provider as well.   To learn more about what you can do with MyChart, go to [email or web address].    Your next appointment:   12 month(s)  The format for your next appointment:   In Person  Provider:   Chrystie Nose, MD {

## 2021-06-24 ENCOUNTER — Other Ambulatory Visit: Payer: Self-pay | Admitting: Internal Medicine

## 2021-09-14 ENCOUNTER — Encounter (HOSPITAL_BASED_OUTPATIENT_CLINIC_OR_DEPARTMENT_OTHER): Payer: Self-pay | Admitting: Emergency Medicine

## 2021-09-14 ENCOUNTER — Emergency Department (HOSPITAL_BASED_OUTPATIENT_CLINIC_OR_DEPARTMENT_OTHER)
Admission: EM | Admit: 2021-09-14 | Discharge: 2021-09-14 | Disposition: A | Discharge status: Home / Self Care | Payer: BC Managed Care – PPO | Attending: Emergency Medicine | Admitting: Emergency Medicine

## 2021-09-14 ENCOUNTER — Other Ambulatory Visit: Payer: Self-pay

## 2021-09-14 ENCOUNTER — Emergency Department (HOSPITAL_BASED_OUTPATIENT_CLINIC_OR_DEPARTMENT_OTHER): Payer: BC Managed Care – PPO

## 2021-09-14 ENCOUNTER — Telehealth (HOSPITAL_BASED_OUTPATIENT_CLINIC_OR_DEPARTMENT_OTHER): Payer: Self-pay | Admitting: Emergency Medicine

## 2021-09-14 DIAGNOSIS — R350 Frequency of micturition: Secondary | ICD-10-CM | POA: Diagnosis present

## 2021-09-14 DIAGNOSIS — D72829 Elevated white blood cell count, unspecified: Secondary | ICD-10-CM | POA: Diagnosis not present

## 2021-09-14 DIAGNOSIS — N200 Calculus of kidney: Secondary | ICD-10-CM | POA: Insufficient documentation

## 2021-09-14 DIAGNOSIS — N3090 Cystitis, unspecified without hematuria: Secondary | ICD-10-CM | POA: Insufficient documentation

## 2021-09-14 HISTORY — DX: Hyperlipidemia, unspecified: E78.5

## 2021-09-14 LAB — URINALYSIS, ROUTINE W REFLEX MICROSCOPIC
Bilirubin Urine: NEGATIVE
Glucose, UA: NEGATIVE mg/dL
Ketones, ur: NEGATIVE mg/dL
Nitrite: POSITIVE — AB
Protein, ur: >=300 mg/dL — AB
Specific Gravity, Urine: 1.025 (ref 1.005–1.030)
pH: 7 (ref 5.0–8.0)

## 2021-09-14 LAB — BASIC METABOLIC PANEL
Anion gap: 7 (ref 5–15)
BUN: 16 mg/dL (ref 6–20)
CO2: 27 mmol/L (ref 22–32)
Calcium: 9.2 mg/dL (ref 8.9–10.3)
Chloride: 106 mmol/L (ref 98–111)
Creatinine, Ser: 0.75 mg/dL (ref 0.44–1.00)
GFR, Estimated: >60 mL/min (ref >60)
Glucose, Bld: 104 mg/dL — ABNORMAL HIGH (ref 70–99)
Potassium: 4 mmol/L (ref 3.5–5.1)
Sodium: 140 mmol/L (ref 135–145)

## 2021-09-14 LAB — URINALYSIS, MICROSCOPIC (REFLEX): RBC / HPF: >50 RBC/hpf (ref 0–5)

## 2021-09-14 LAB — CBC
HCT: 41.4 % (ref 36.0–46.0)
Hemoglobin: 13.6 g/dL (ref 12.0–15.0)
MCH: 30.1 pg (ref 26.0–34.0)
MCHC: 32.9 g/dL (ref 30.0–36.0)
MCV: 91.6 fL (ref 80.0–100.0)
Platelets: 257 10*3/uL (ref 150–400)
RBC: 4.52 MIL/uL (ref 3.87–5.11)
RDW: 13.2 % (ref 11.5–15.5)
WBC: 12.3 10*3/uL — ABNORMAL HIGH (ref 4.0–10.5)
nRBC: 0 % (ref 0.0–0.2)

## 2021-09-14 MED ORDER — SODIUM CHLORIDE 0.9 % IV BOLUS
1000.0000 mL | Freq: Once | INTRAVENOUS | Status: AC
  Administered 2021-09-14: 1000 mL via INTRAVENOUS

## 2021-09-14 MED ORDER — TAMSULOSIN HCL 0.4 MG PO CAPS
0.4000 mg | ORAL_CAPSULE | Freq: Once | ORAL | Status: AC
Start: 2021-09-14 — End: 2021-09-14
  Administered 2021-09-14: 0.4 mg via ORAL
  Filled 2021-09-14: qty 1

## 2021-09-14 MED ORDER — OXYCODONE-ACETAMINOPHEN 5-325 MG PO TABS: 1.0000 | ORAL_TABLET | Freq: Four times a day (QID) | ORAL | 0 refills | Status: DC | PRN

## 2021-09-14 MED ORDER — LACTATED RINGERS IV BOLUS: 1000.0000 mL | Freq: Once | INTRAVENOUS | Status: DC

## 2021-09-14 MED ORDER — MORPHINE SULFATE (PF) 4 MG/ML IV SOLN
4.0000 mg | Freq: Once | INTRAVENOUS | Status: AC
  Administered 2021-09-14: 4 mg via INTRAVENOUS
  Filled 2021-09-14: qty 1

## 2021-09-14 MED ORDER — TAMSULOSIN HCL 0.4 MG PO CAPS: 0.4000 mg | ORAL_CAPSULE | Freq: Every day | ORAL | 0 refills | Status: DC

## 2021-09-14 MED ORDER — KETOROLAC TROMETHAMINE 15 MG/ML IJ SOLN
15.0000 mg | Freq: Once | INTRAMUSCULAR | Status: AC
  Administered 2021-09-14: 15 mg via INTRAVENOUS
  Filled 2021-09-14: qty 1

## 2021-09-14 MED ORDER — SULFAMETHOXAZOLE-TRIMETHOPRIM 800-160 MG PO TABS: 1.0000 | ORAL_TABLET | Freq: Two times a day (BID) | ORAL | 0 refills | Status: AC

## 2021-09-14 MED ORDER — SODIUM CHLORIDE 0.9 % IV SOLN
1.0000 g | Freq: Once | INTRAVENOUS | Status: AC
  Administered 2021-09-14: 1 g via INTRAVENOUS
  Filled 2021-09-14: qty 10

## 2021-09-14 NOTE — ED Provider Notes (Signed)
[facility] Provider Note   CSN: [id number] Arrival date & time: 09/14/21  1318     History  Chief Complaint  Patient presents with   Flank Pain    Jaclyn Tate is a 56 y.o. female with history of overactive bladder presents to the ED for evaluation of sudden onset right flank pain radiating down to the right lower quadrant that began earlier today.  Patient had mild nausea without vomiting at this time.  Pain is described as sharp.  She is also endorsing increased urinary frequency and dysuria.  When she went to the bathroom today she noted blood and tissue within the toilet after urinated.  She took 2 Tylenol prior to arrival.  No previous history of kidney stones.  She has been seen a few times in the last several months for urinary frequency and has been treated for UTI several times.  Denies fevers, chills.   Flank Pain       Home Medications Prior to Admission medications   Medication Sig Start Date End Date Taking? Authorizing Provider  oxyCODONE-acetaminophen (PERCOCET/ROXICET) 5-325 MG tablet Take 1 tablet by mouth every 6 (six) hours as needed for severe pain. 09/14/21  Yes Kathe Becton R, PA-C  sulfamethoxazole-trimethoprim (BACTRIM DS) 800-160 MG tablet Take 1 tablet by mouth 2 (two) times daily for 5 days. 09/14/21 09/19/21 Yes Tonye Pearson, PA-C  tamsulosin (FLOMAX) 0.4 MG CAPS capsule Take 1 capsule (0.4 mg total) by mouth daily. 09/14/21  Yes Tonye Pearson, PA-C  ALPRAZolam (XANAX) 0.25 MG tablet Take 0.25 mg by mouth 3 (three) times daily as needed. 03/04/21   [provider]  atorvastatin (LIPITOR) 80 MG tablet TAKE 1 TABLET BY MOUTH EVERY DAY 06/25/21   Hilty, Nadean Corwin, MD  Cholecalciferol 25 MCG (1000 UT) tablet Take by mouth.    [provider]  estradiol (ESTRACE) 2 MG tablet Take 1 tablet by mouth daily.    [provider]      Allergies    Patient has no known allergies.    Review of  Systems   Review of Systems  Genitourinary:  Positive for flank pain.    Physical Exam Updated Vital Signs BP 117/68   Pulse 87   Temp 98.8 F (37.1 C) (Oral)   Resp 15   Ht 5\' 2"  (1.575 m)   Wt 77.6 kg   SpO2 98%   BMI 31.28 kg/m  Physical Exam Vitals and nursing note reviewed.  Constitutional:      General: She is not in acute distress.    Appearance: She is not ill-appearing.  HENT:     Head: Atraumatic.  Eyes:     Conjunctiva/sclera: Conjunctivae normal.  Cardiovascular:     Rate and Rhythm: Normal rate and regular rhythm.     Pulses: Normal pulses.     Heart sounds: No murmur heard. Pulmonary:     Effort: Pulmonary effort is normal. No respiratory distress.     Breath sounds: Normal breath sounds.  Abdominal:     General: Abdomen is flat. There is no distension.     Palpations: Abdomen is soft.     Tenderness: There is abdominal tenderness.     Comments: Tenderness to the right upper quadrant/flank down into the right lower quadrant.  Negative CVA tenderness bilaterally.  Abdomen is otherwise soft and nondistended.  Musculoskeletal:        General: Normal range of motion.     Cervical back: Normal  range of motion.  Skin:    General: Skin is warm and dry.     Capillary Refill: Capillary refill takes less than 2 seconds.  Neurological:     General: No focal deficit present.     Mental Status: She is alert.  Psychiatric:        Mood and Affect: Mood normal.     ED Results / Procedures / Treatments   Labs (all labs ordered are listed, but only abnormal results are displayed) Labs Reviewed  URINALYSIS, ROUTINE W REFLEX MICROSCOPIC - Abnormal; Notable for the following components:      Result Value   Color, Urine PINK (*)    APPearance CLOUDY (*)    Hgb urine dipstick LARGE (*)    Protein, ur >=300 (*)    Nitrite POSITIVE (*)    Leukocytes,Ua TRACE (*)    All other components within normal limits  BASIC METABOLIC PANEL - Abnormal; Notable for the  following components:   Glucose, Bld 104 (*)    All other components within normal limits  CBC - Abnormal; Notable for the following components:   WBC 12.3 (*)    All other components within normal limits  URINALYSIS, MICROSCOPIC (REFLEX) - Abnormal; Notable for the following components:   Bacteria, UA MANY (*)    All other components within normal limits  URINE CULTURE    EKG None  Radiology CT Renal Stone Study  Result Date: 09/14/2021 CLINICAL DATA:  Patient developed a sharp pain from the right flank and right lower quadrant while doing pelvic floor therapy today. Hematuria and urgency. EXAM: CT ABDOMEN AND PELVIS WITHOUT CONTRAST TECHNIQUE: Multidetector CT imaging of the abdomen and pelvis was performed following the standard protocol without IV contrast. RADIATION DOSE REDUCTION: This exam was performed according to the departmental dose-optimization program which includes automated exposure control, adjustment of the mA and/or kV according to patient size and/or use of iterative reconstruction technique. COMPARISON:  None Available. FINDINGS: Lower chest: Clear lung bases. Hepatobiliary: Liver normal in size. Diffuse decreased attenuation of the liver consistent with fatty infiltration. No mass. Normal gallbladder. No bile duct dilation. Pancreas: Unremarkable. No pancreatic ductal dilatation or surrounding inflammatory changes. Spleen: Normal in size without focal abnormality. Adrenals/Urinary Tract: Normal adrenal glands. Kidneys normal in size, orientation and position. No renal masses. No stones. No hydronephrosis. Subtle stranding adjacent to the right renal pelvis and proximal right ureter. Additional mild stranding noted along the course of the right ureter. Mild right ureteral prominence. No ureteral stone. Normal left ureter. Normal bladder. Stomach/Bowel: Stomach is within normal limits. Appendix appears normal. No evidence of bowel wall thickening, distention, or inflammatory  changes. Vascular/Lymphatic: Mild aortic atherosclerosis. No aneurysm. No enlarged lymph nodes. Reproductive: Status post hysterectomy. No adnexal masses. Other: No abdominal wall hernia or abnormality. No abdominopelvic ascites. Musculoskeletal: No acute or significant osseous findings. IMPRESSION: 1. Mild stranding adjacent to the right renal pelvis and prominent right ureter, without evidence of a ureteral stone. Findings consistent with a recently passed stone. No bladder stone. 2. No other evidence of an acute abnormality within the abdomen or pelvis. No intrarenal stones. 3. Hepatic steatosis. 4. Aortic atherosclerosis. Electronically Signed   By: Lajean Manes M.D.   On: 09/14/2021 16:05    Procedures Procedures    Medications Ordered in ED Medications  morphine (PF) 4 MG/ML injection 4 mg (4 mg Intravenous Given 09/14/21 1541)  ketorolac (TORADOL) 15 MG/ML injection 15 mg (15 mg Intravenous Given 09/14/21 1541)  cefTRIAXone (  ROCEPHIN) 1 g in sodium chloride 0.9 % 100 mL IVPB (0 g Intravenous Stopped 09/14/21 1637)  sodium chloride 0.9 % bolus 1,000 mL (0 mLs Intravenous Stopped 09/14/21 1637)  tamsulosin (FLOMAX) capsule 0.4 mg (0.4 mg Oral Given 09/14/21 1624)    ED Course/ Medical Decision Making/ A&P                           Medical Decision Making Amount and/or Complexity of Data Reviewed Labs: ordered. Radiology: ordered.  Risk Prescription drug management.   Social determinants of health:  Social History   Socioeconomic History   Marital status: Married    Spouse name: Not on file   Number of children: Not on file   Years of education: Not on file   Highest education level: Not on file  Occupational History   Not on file  Tobacco Use   Smoking status: Never   Smokeless tobacco: Never  Substance and Sexual Activity   Alcohol use: Yes    Comment: social on occ   Drug use: No   Sexual activity: Not Currently  Other Topics Concern   Not on file  Social History  Narrative   Not on file   Social Determinants of Health   Financial Resource Strain: Not on file  Food Insecurity: Not on file  Transportation Needs: Not on file  Physical Activity: Not on file  Stress: Not on file  Social Connections: Not on file  Intimate Partner Violence: Not on file     Initial impression:  This patient presents to the ED for concern of right flank pain, this involves an extensive number of treatment options, and is a complaint that carries with it a high risk of complications and morbidity.   Differentials include cholecystitis, cholelithiasis, UTI, kidney stone, pyelonephritis,    Comorbidities affecting care:  Hyperlipidemia  Additional history obtained: Husband, urgent care records from 07/22/2021  Lab Tests  I Ordered, reviewed, and interpreted labs and EKG.  The pertinent results include:  BMP normal CBC with leukocytosis 12.3 UA with large amounts of nitrates, leukocytes and bacteria-significant UTI  Imaging Studies ordered:  I ordered imaging studies including  BMP, CBC normal Urinalysis with significant infection Pending urine culture I independently visualized and interpreted imaging and I agree with the radiologist interpretation.   Cardiac Monitoring:  The patient was maintained on a cardiac monitor.  I personally viewed and interpreted the cardiac monitored which showed an underlying rhythm of: Sinus rhythm   Medicines ordered and prescription drug management:  I ordered medication including: Normal saline 1 L bolus 1 g ceftriaxone IV Morphine 4 mg IV Toradol 15 mg IV Flomax Reevaluation of the patient after these medicines showed that the patient improved I have reviewed the patients home medicines and have made adjustments as needed    ED Course/Re-evaluation: 56 year old female presents emergency department for evaluation of right flank pain And urinary symptoms.  Vitals without significant abnormality.  On exam, she has  right upper quadrant tenderness down to the right lower quadrant.  Negative CVA tenderness bilaterally.  Abdomen is otherwise soft and nondistended.  Urine shows significant urinary tract infection.  Urine culture ordered.  Concern for pyelonephritis versus nephrolithiasis so CT renal study was performed which was notable for postinflammatory changes consistent with a recently passed kidney stone.  Patient was given 1 L NS bolus along with ceftriaxone 1 g, morphine and Toradol 15 mg IV.  Recent urgent care records  suggest that patient most recently is taking Keflex and Macrobid.  Will discharge home with Bactrim x5 days.  We will also discharge home with Flomax and a few tablets of pain medication and patient is to follow-up with urologist.  Patient expresses understanding is amenable to plan.  Disposition:  After consideration of the diagnostic results, physical exam, history and the patients response to treatment feel that the patent would benefit from discharge.   Cystitis Kidney stone: Plan and management as described above. Discharged home in good condition.  Final Clinical Impression(s) / ED Diagnoses Final diagnoses:  Cystitis  Kidney stone    Rx / DC Orders ED Discharge Orders          Ordered    tamsulosin (FLOMAX) 0.4 MG CAPS capsule  Daily        09/14/21 1619    oxyCODONE-acetaminophen (PERCOCET/ROXICET) 5-325 MG tablet  Every 6 hours PRN        09/14/21 1619    sulfamethoxazole-trimethoprim (BACTRIM DS) 800-160 MG tablet  2 times daily        09/14/21 1625              Tonye Pearson, [place] 09/14/21 1740    Wyvonnia Dusky, MD 09/14/21 1811

## 2021-09-14 NOTE — Discharge Instructions (Addendum)
You were diagnosed with a kidney stone that appears to have recently passed through to your bladder as well as a urinary tract infection.  I have sent you home with an antibiotic as well as Flomax and pain medication to help with your discomfort.  Per the most recent 2 urgent care visits, you have taken Macrobid and Keflex, so we will try different medication today.  Please take the antibiotic as directed-I have sent in a culture of your urine which will determine whether the antibiotic that we chose is appropriate.  Please follow-up with your urologist later this week for reevaluation.

## 2021-09-14 NOTE — ED Triage Notes (Addendum)
Pt reports she has been doing pelvic floor therapy and today she has had a sharp pain from RT flank to RLQ; reports hematuria and urgency; took 2 tylenol just PTA (unsure of strength)

## 2021-09-14 NOTE — ED Notes (Signed)
Pt transported to CT ?

## 2021-09-17 LAB — URINE CULTURE: Culture: >=100000 — AB

## 2021-09-19 ENCOUNTER — Telehealth: Payer: Self-pay | Admitting: *Deleted

## 2021-09-19 NOTE — Telephone Encounter (Signed)
Post ED Visit - Positive Culture Follow-up  Culture report reviewed by antimicrobial stewardship pharmacist: [facility] []  , Pharm.D. []  Enzo Bi, Pharm.D., BCPS AQ-ID []  , Pharm.D., BCPS []  Celedonio Miyamoto, Pharm.D., BCPS []  [place], Garvin Fila.D., BCPS, AAHIVP []  , Pharm.D., BCPS, AAHIVP [x]  Georgina Pillion, PharmD, BCPS []  , PharmD, BCPS []  [place], PharmD, BCPS []  [address], PharmD []  , PharmD, BCPS []  Estella Husk, PharmD  Pharmacy Team []  Lysle Pearl, PharmD []  , PharmD []  Phillips Climes, PharmD []  , Rph []  Agapito Games) , PharmD []  Verlan Friends, PharmD []  , PharmD []  Mervyn Gay, PharmD []  , PharmD []  Vinnie Level, PharmD []  [facility], PharmD []  , PharmD []  Len Childs, PharmD   Positive urine culture Treated with Sulfamethoxazole-Trimethoprim, organism sensitive to the same and no further patient follow-up is required at this time.  [facility] 09/19/2021, 8:43 AM

## 2021-12-28 ENCOUNTER — Other Ambulatory Visit: Payer: Self-pay | Admitting: Internal Medicine

## 2022-01-23 ENCOUNTER — Other Ambulatory Visit: Payer: Self-pay | Admitting: *Deleted

## 2022-01-23 DIAGNOSIS — E785 Hyperlipidemia, unspecified: Secondary | ICD-10-CM

## 2022-04-15 ENCOUNTER — Ambulatory Visit (HOSPITAL_BASED_OUTPATIENT_CLINIC_OR_DEPARTMENT_OTHER): Payer: BC Managed Care – PPO | Admitting: Internal Medicine

## 2022-06-27 ENCOUNTER — Other Ambulatory Visit: Payer: Self-pay | Admitting: Internal Medicine

## 2022-07-07 ENCOUNTER — Other Ambulatory Visit: Payer: Self-pay | Admitting: Internal Medicine

## 2022-07-28 ENCOUNTER — Other Ambulatory Visit: Payer: Self-pay

## 2022-07-28 DIAGNOSIS — E7800 Pure hypercholesterolemia, unspecified: Secondary | ICD-10-CM

## 2022-07-28 DIAGNOSIS — E785 Hyperlipidemia, unspecified: Secondary | ICD-10-CM

## 2022-07-29 LAB — LIPID PANEL
Chol/HDL Ratio: 2.5 ratio (ref 0.0–4.4)
Cholesterol, Total: 152 mg/dL (ref 100–199)
HDL: 61 mg/dL (ref >39)
LDL Chol Calc (NIH): 73 mg/dL (ref 0–99)
Triglycerides: 98 mg/dL (ref 0–149)
VLDL Cholesterol Cal: 18 mg/dL (ref 5–40)

## 2022-07-30 ENCOUNTER — Other Ambulatory Visit: Payer: Self-pay | Admitting: Internal Medicine

## 2022-08-04 ENCOUNTER — Ambulatory Visit (HOSPITAL_BASED_OUTPATIENT_CLINIC_OR_DEPARTMENT_OTHER): Payer: BC Managed Care – PPO | Admitting: Internal Medicine

## 2022-08-04 ENCOUNTER — Encounter (HOSPITAL_BASED_OUTPATIENT_CLINIC_OR_DEPARTMENT_OTHER): Payer: Self-pay | Admitting: Internal Medicine

## 2022-08-04 VITALS — BP 128/80 | HR 81 | Ht 62.0 in | Wt 174.2 lb

## 2022-08-04 DIAGNOSIS — E785 Hyperlipidemia, unspecified: Secondary | ICD-10-CM

## 2022-08-04 NOTE — Progress Notes (Signed)
OFFICE CONSULT NOTE  Chief Complaint:  Follow-up dyslipidemia  Primary Care Physician: Verlee Rossetti, PA-C  HPI:  Jaclyn Tate is a 57 y.o. female who is being seen today for the evaluation of dyslipidemia at the request of Verlee Rossetti, PA-C.  This is a pleasant 57 year old female who is referred for evaluation of dyslipidemia.  She had blood work recently through her GYN noting an elevated total cholesterol 270, HDL 65, LDL 181 and triglycerides 161.  In discussion with her she is known that she has had elevated cholesterol at least since her 30s.  For a while she was on a statin medication but says that she did not notice a significant difference in her numbers.  Her PCP essentially gave her an option she fell at the time as to whether or not she wanted to continue medicine or not and therefore she stopped it.  She does not recall the name of the statin that she was taking.  She knows that she was not symptomatic with it.  She is not aware of heart disease in her family as she is adopted, although her cholesterol numbers are very high.  She does have a son who has elevated cholesterol in his 63s and he is in the [employer].  He is not currently on any treatment.  03/01/2018  Jaclyn Tate is returns today for follow-up of dyslipidemia.  Overall she is doing well denies any side effects with the statin.  Her cholesterol profile is improved significantly with total cholesterol 144, triglycerides 45, HDL 59 and LDL of 76.  Fortunately she also had a 0 coronary calcium score without any extracardiac findings.  Based on this there is some body of literature suggesting that she may not need therapy however she does meet criteria for 50% reduction in LDL cholesterol based on her LDL of 181.  At this point she has had more than 50% reduction which also suggest dietary involvement.  Overall I think this current lipid profile indicates a very low risk.  03/07/2019  Jaclyn Tate returns for  follow-up today. She is without complaints. She recently started on estrogen supplementation for peri-menopausal symptoms and is much improved. She denies any chest pain or dyspnea. No side-effects from Lipitor. She is due for repeat lipid testing.  03/08/2020  Jaclyn Tate is seen today in follow-up.  Overall she is doing well.  Her cholesterol is improved significantly from the Lipitor.  Her labs 3 weeks ago showed total cholesterol 166, triglycerides 115, HDL 55 and LDL of 90.  Her goal LDL is less than 100.  03/21/2021  Jaclyn Tate returns today for follow-up.  She continues to do well.  Her cholesterols been very stable.  Repeat lipids show total cholesterol of 172, HDL 55, triglycerides 149 and LDL 91.  Have encouraged her to continue with diet and exercise and work to try to lower the weight somewhat.  She says she is about to go on 1 week cruise to the [place].  08/04/2022  Jaclyn Tate returns today for follow-up.  She continues to have good lipid control.  In fact her cholesterol is lower than it was last year.  Total 152, triglycerides 98, HDL 61 and LDL 73.  Previously her LDL was down to 91.  She continues on atorvastatin 80 mg daily.  Recently she was started on Wegovy for weight loss but is only been on it for couple of weeks.  Ultimately this may improve her cholesterol hopefully enough  until where we could consider reducing her atorvastatin dose.  She thinks she might be having some side effects from that medication.  PMHx:  Past Medical History:  Diagnosis Date   Hyperlipidemia    Medical history non-contributory     Past Surgical History:  Procedure Laterality Date   CESAREAN SECTION     KNEE ARTHROSCOPY     VAGINAL HYSTERECTOMY      FAMHx:  Family History  Adopted: Yes  Family history unknown: Yes    SOCHx:   reports that she has never smoked. She has never used smokeless tobacco. She reports current alcohol use. She reports that she does not use drugs.  ALLERGIES:   No Known Allergies  ROS: Pertinent items noted in HPI and remainder of comprehensive ROS otherwise negative.  HOME MEDS: Current Outpatient Medications on File Prior to Visit  Medication Sig Dispense Refill   amoxicillin (AMOXIL) 500 MG capsule Take 500 mg by mouth 2 (two) times daily.     atorvastatin (LIPITOR) 80 MG tablet TAKE 1 TABLET (80 MG TOTAL) BY MOUTH DAILY. PLEASE KEEP SCHEDULED APPOINTMENT FOR FUTURE REFILLS. THANK YOU. 30 tablet 0   Cholecalciferol 25 MCG (1000 UT) tablet Take by mouth.     Cranberry 400 MG CAPS Take 1 capsule by mouth daily.     estradiol (ESTRACE) 2 MG tablet Take 1 tablet by mouth daily.     fexofenadine (ALLEGRA) 180 MG tablet Take 180 mg by mouth daily.     mirabegron ER (MYRBETRIQ) 50 MG TB24 tablet Take 50 mg by mouth daily.     sertraline (ZOLOFT) 25 MG tablet Take 25 mg by mouth daily.     valACYclovir (VALTREX) 1000 MG tablet Take 1,000 mg by mouth as needed (fever blister).     WEGOVY 0.25 MG/0.5ML SOAJ Inject 0.25 mg into the skin once a week.     No current facility-administered medications on file prior to visit.    LABS/IMAGING: No results found for this or any previous visit (from the past 48 hour(s)). No results found.  LIPID PANEL:    Component Value Date/Time   CHOL 152 07/28/2022 0836   TRIG 98 07/28/2022 0836   HDL 61 07/28/2022 0836   CHOLHDL 2.5 07/28/2022 0836   LDLCALC 73 07/28/2022 0836    WEIGHTS: Wt Readings from Last 3 Encounters:  08/04/22 174 lb 3.2 oz (79 kg)  09/14/21 171 lb (77.6 kg)  03/21/21 169 lb 9.6 oz (76.9 kg)    VITALS: BP 128/80 (BP Location: Left Arm, Patient Position: Sitting, Cuff Size: Normal)   Pulse 81   Ht 5\' 2"  (1.575 m)   Wt 174 lb 3.2 oz (79 kg)   SpO2 98%   BMI 31.86 kg/m   EXAM: Deferred  EKG: Deferred  ASSESSMENT: Marked dyslipidemia, primarily high LDL Possible FH (patient is adopted)-goal LDL less than 100 0 coronary artery calcium (2019)  PLAN: 1.   Jaclyn Tate  continues to do well on high intensity atorvastatin.  Cholesterol is even lower than it was last year.  Recently she started on some weight loss medication.  It is possible her numbers will get even lower and we might be able to reduce her atorvastatin from 80 to 40 mg at some point because she may be having some side effects.  For now it is tolerable and I would recommend continuing on her current dose of medication.  Will plan a earlier follow-up in 6 months with repeat lipids at that time.  Chrystie Nose, MD, [facility], FACP    [facility]  Medical Director of the Advanced Lipid Disorders &  Cardiovascular Risk Reduction Clinic Diplomate of the American Board of Clinical Lipidology Attending Cardiologist  Direct Dial: [phone]  Fax: [phone]  Website:  [email or web address] Nicely Jasir Rother 08/04/2022, 9:42 AM

## 2022-08-04 NOTE — Patient Instructions (Signed)
Medication Instructions:  NO CHANGES  *If you need a refill on your cardiac medications before your next appointment, please call your pharmacy*   Lab Work: FASTING lab work to check cholesterol in 6 months  If you have labs (blood work) drawn today and your tests are completely normal, you will receive your results only by: MyChart Message (if you have MyChart) OR A paper copy in the mail If you have any lab test that is abnormal or we need to change your treatment, we will call you to review the results.   Follow-Up: At [facility], you and your health needs are our priority.  As part of our continuing mission to provide you with exceptional heart care, we have created designated Provider Care Teams.  These Care Teams include your primary Cardiologist (physician) and Advanced Practice Providers (APPs -  Physician Assistants and Nurse Practitioners) who all work together to provide you with the care you need, when you need it.  We recommend signing up for the patient portal called "MyChart".  Sign up information is provided on this After Visit Summary.  MyChart is used to connect with patients for Virtual Visits (Telemedicine).  Patients are able to view lab/test results, encounter notes, upcoming appointments, etc.  Non-urgent messages can be sent to your provider as well.   To learn more about what you can do with MyChart, go to [email or web address].    Your next appointment:    6 months with Dr. Hilty 

## 2022-08-30 ENCOUNTER — Other Ambulatory Visit: Payer: Self-pay | Admitting: Internal Medicine

## 2022-10-01 IMAGING — MG MM DIGITAL DIAGNOSTIC UNILAT*L* W/ TOMO W/ CAD
4 series · 4 of 12 positions shown · non-contrast
Comparison: Previous exam(s).

CLINICAL DATA: 54-year-old female for further evaluation of
possible LEFT breast asymmetry on screening mammogram.

EXAM:
DIGITAL DIAGNOSTIC UNILATERAL LEFT MAMMOGRAM WITH TOMOSYNTHESIS AND
CAD
TECHNIQUE: Left digital diagnostic mammography and breast tomosynthesis was
performed. The images were evaluated with computer-aided detection.

[L CC synth-2D]
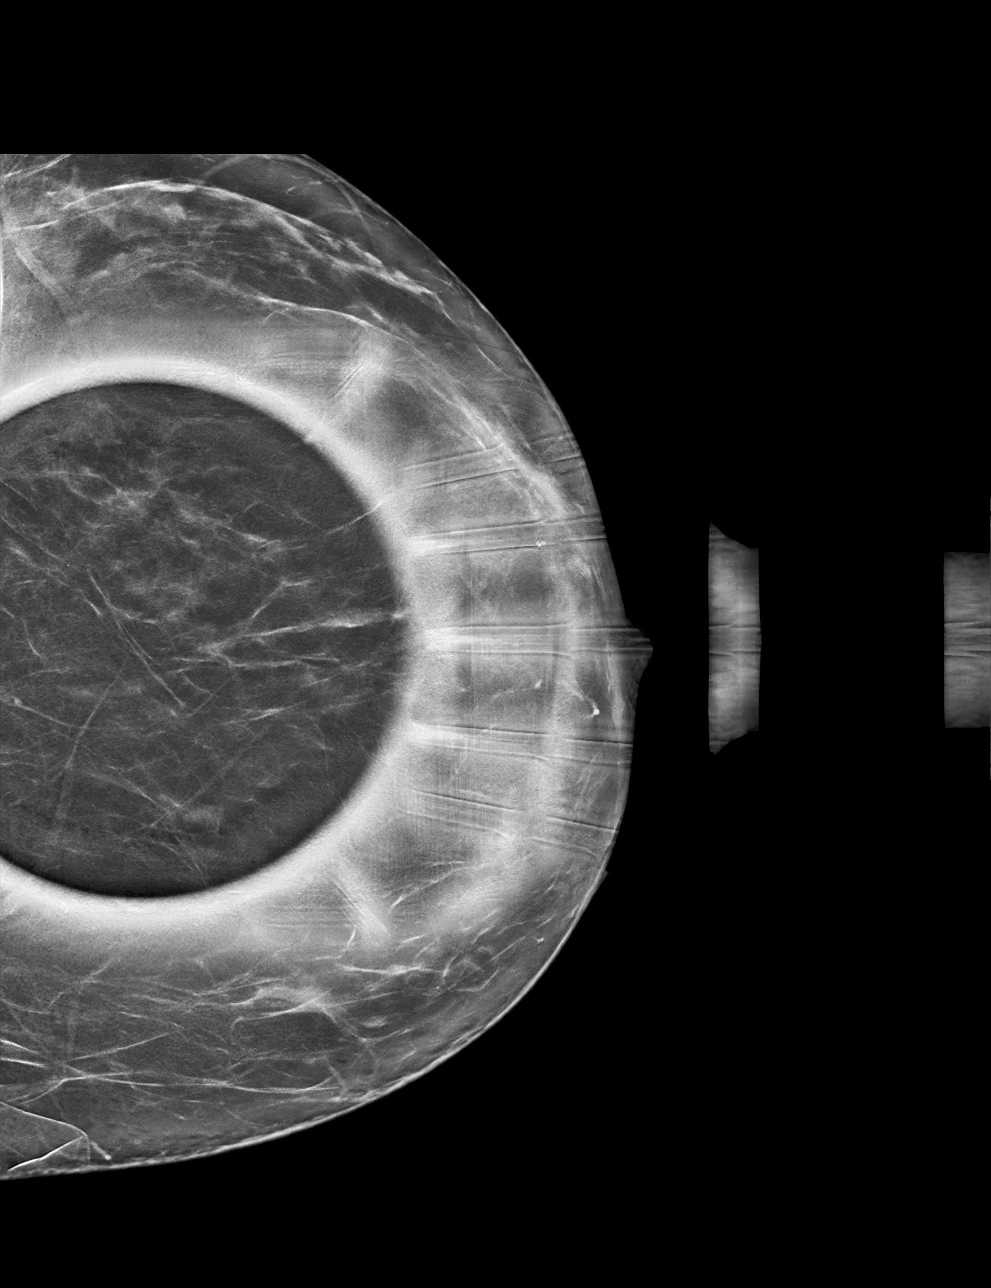

[L ML synth-2D]
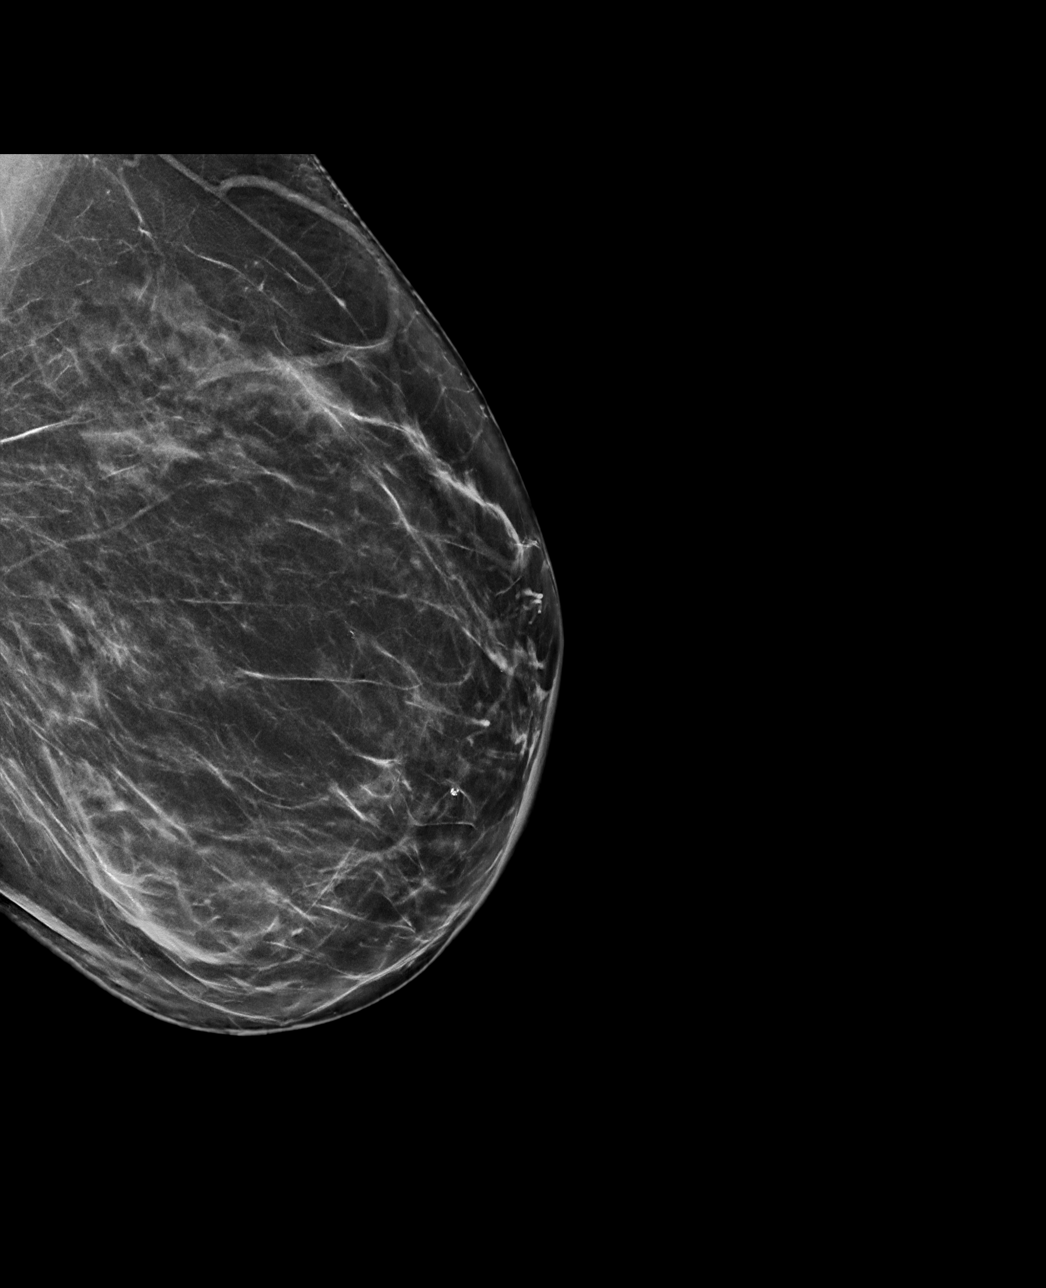

[L CC tomo · tomo slice 36/71.0]
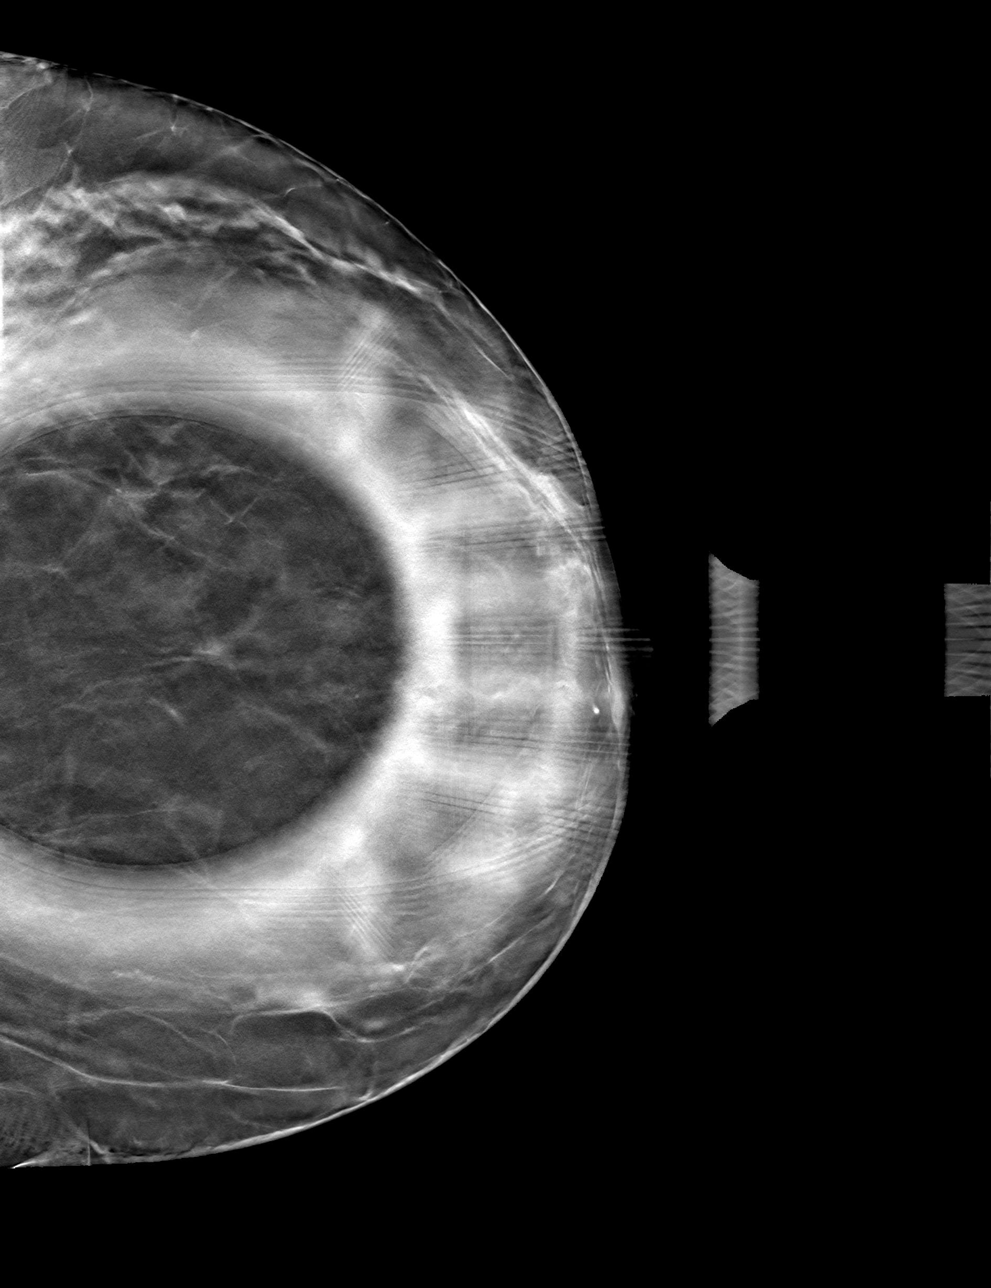

[L ML tomo · tomo slice 41/80.0]
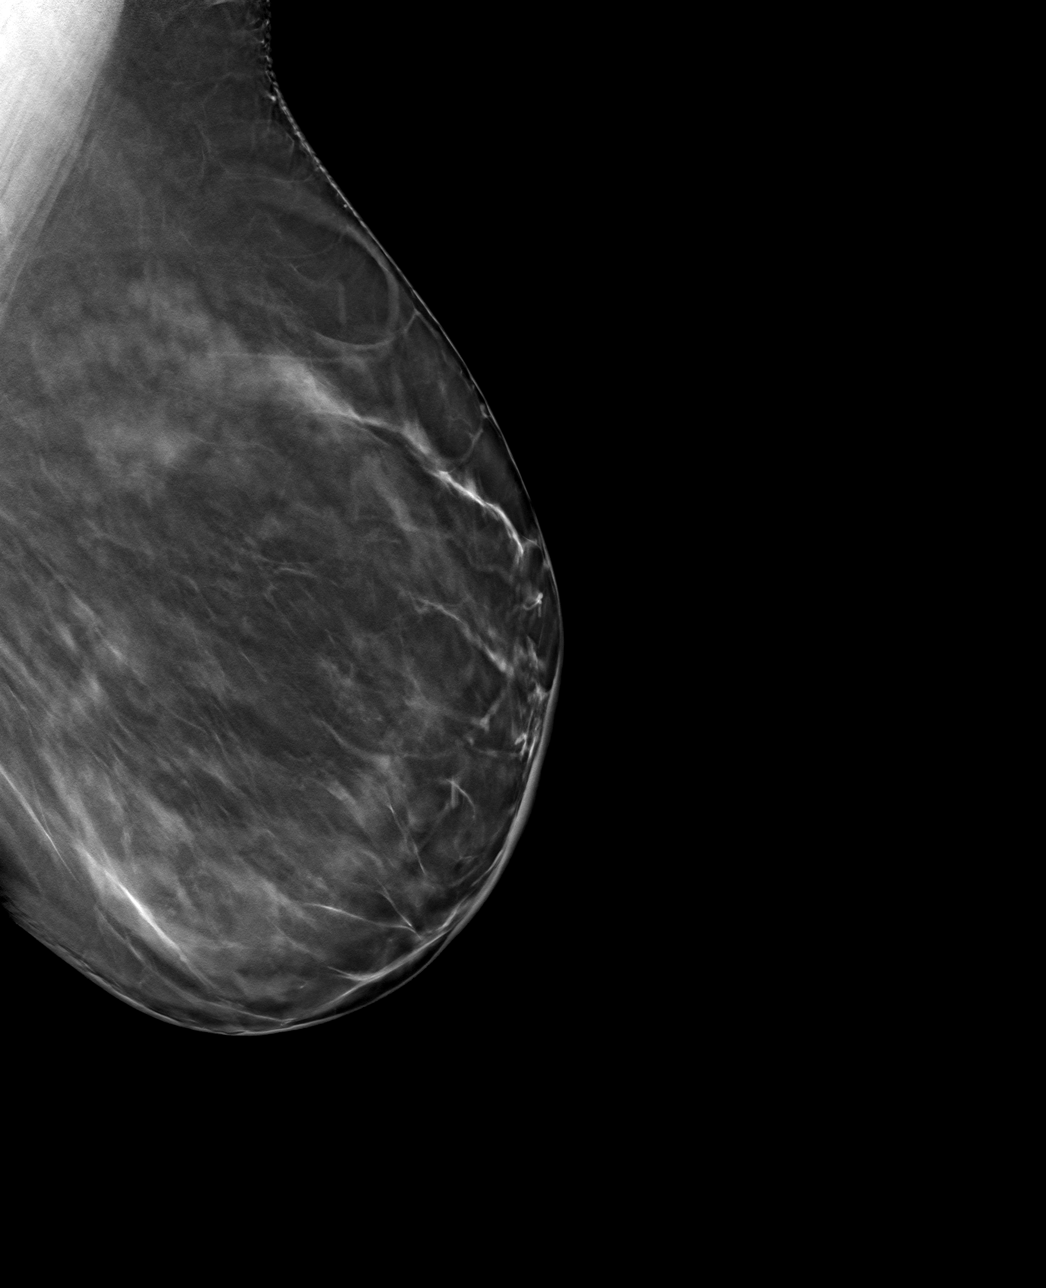

[4 of 12 positions shown; findings below may reference images not displayed]

ACR Breast Density Category b: There are scattered areas of
fibroglandular density.
FINDINGS: 2D/3D full field and spot compression views of the LEFT breast
demonstrate no persistent abnormality at the site of the screening
study finding. On the CC spot compression view, the screening study
asymmetry disperses without residual abnormality.
IMPRESSION: No persistent abnormality at the site of the screening study
finding.

RECOMMENDATION:
Bilateral screening mammogram in 1 year.

I have discussed the findings and recommendations with the patient.
If applicable, a reminder letter will be sent to the patient
regarding the next appointment.

BI-RADS CATEGORY  1: Negative.

## 2023-03-07 ENCOUNTER — Other Ambulatory Visit: Payer: Self-pay | Admitting: Internal Medicine

## 2023-09-06 ENCOUNTER — Other Ambulatory Visit: Payer: Self-pay | Admitting: Internal Medicine

## 2023-09-19 ENCOUNTER — Other Ambulatory Visit: Payer: Self-pay | Admitting: Internal Medicine

## 2023-10-04 ENCOUNTER — Other Ambulatory Visit: Payer: Self-pay | Admitting: Internal Medicine

## 2023-12-17 ENCOUNTER — Ambulatory Visit: Payer: Self-pay | Admitting: Internal Medicine

## 2023-12-23 NOTE — Progress Notes (Deleted)
  Cardiology Office Note   Date:  12/23/2023  ID:  Jaclyn Tate, DOB 04/18/1965, MRN [id number] PCP: Jaclyn Tate  [facility] Providers Cardiologist:  Jaclyn JAYSON Maxcy, MD { Click to update primary MD,subspecialty MD or APP then REFRESH:1}    PMH Dyslipidemia Coronary calcium  score of 0 in 2019  Seen by Dr. Maxcy in [facility] 11/2017. Referred by OB/GYN for elevated total cholesterol 270, HDL 65, LDL 181 and triglycerides 883.  She had known elevated cholesterol since her 30s.  She had been on statin therapy in the past but did not notice significant improvement in her numbers.  She is not aware of heart disease in her family but she is adopted.  She has a son whose in his 57s and has elevated cholesterol.  She was started on rosuvastatin 40 mg daily.  Cholesterol improved with LDL 76.  She had a 0 coronary calcium  score. Was later changed to atorvastatin  with continued good lipid control.  Last clinic visit was 08/04/2022 with Dr. Maxcy.  LDL was 73.  She remained on atorvastatin  80 mg daily and had recently started [facility] for weight loss.  She was concerned that she was having side effects from the medication and wanted to reduce dose from 80 to 40 mg daily.  Lipid panel 06/12/2023 with total cholesterol 171, triglycerides 94, HDL 65, LDL-C 87.   History of Present Illness Jaclyn Tate is a 58 y.o. female ***  ROS: ***  Studies Reviewed      ***  No results found for: LIPOA  Risk Assessment/Calculations {Does this patient have ATRIAL FIBRILLATION?[phone]} No BP recorded.  {Refresh Note OR Click here to enter BP  :1}***       Physical Exam VS:  There were no vitals taken for this visit.   Wt Readings from Last 3 Encounters:  08/04/22 174 lb 3.2 oz (79 kg)  09/14/21 171 lb (77.6 kg)  03/21/21 169 lb 9.6 oz (76.9 kg)    GEN: Well nourished, well developed in no acute distress NECK: No JVD; No carotid bruits CARDIAC: ***RRR, no  murmurs, rubs, gallops RESPIRATORY:  Clear to auscultation without rales, wheezing or rhonchi  ABDOMEN: Soft, non-tender, non-distended EXTREMITIES:  No edema; No deformity   ASSESSMENT AND PLAN ***    {Are you ordering a CV Procedure (e.g. stress test, cath, DCCV, TEE, etc)?   Press F2        :[id number]}  Dispo: ***  Signed, Jaclyn Bane, NP-C

## 2023-12-24 ENCOUNTER — Encounter (HOSPITAL_BASED_OUTPATIENT_CLINIC_OR_DEPARTMENT_OTHER): Payer: Self-pay | Admitting: Nurse Practitioner

## 2024-01-24 IMAGING — CT CT RENAL STONE PROTOCOL
2 of 4 series · 16 of 46 positions shown, 18 images · non-contrast
Comparison: None Available.

CLINICAL DATA: Patient developed a sharp pain from the right flank
and right lower quadrant while doing pelvic floor therapy today.
Hematuria and urgency.



[Series 2: axial st · axial · 0.96mm/px · z∈[-480,-60]mm · 13 of 92 slices shown, 15 images]
[im 4/92  soft-tissue]
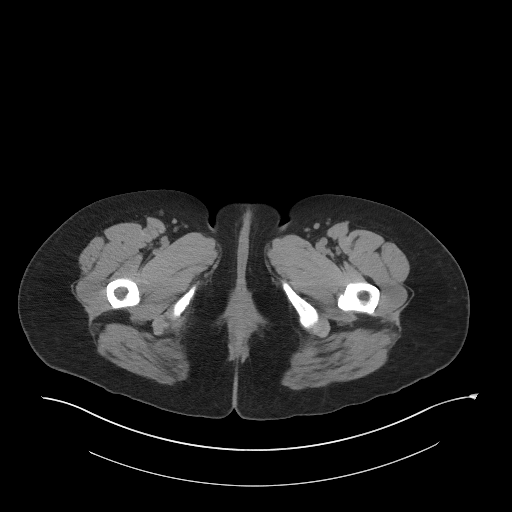
[im 4/92  bone]
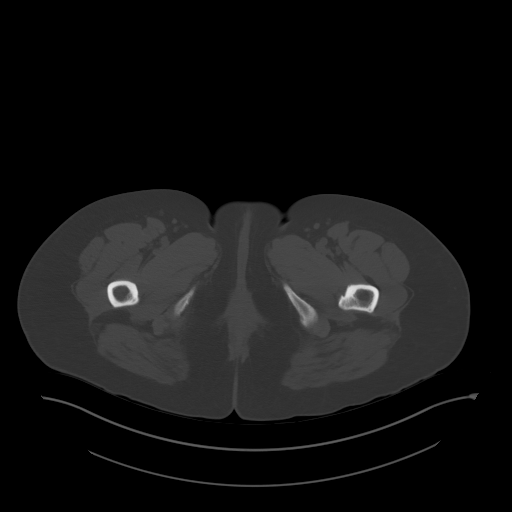
[im 12/92  soft-tissue]
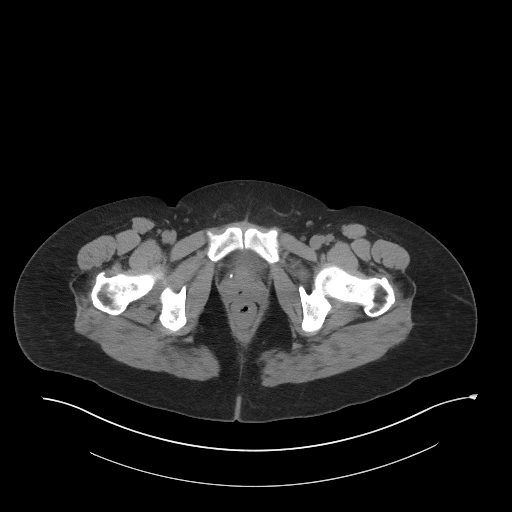
[im 19/92  soft-tissue]
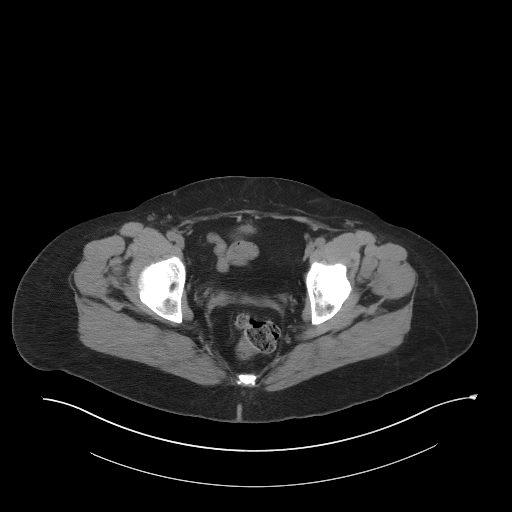
[im 27/92  soft-tissue]
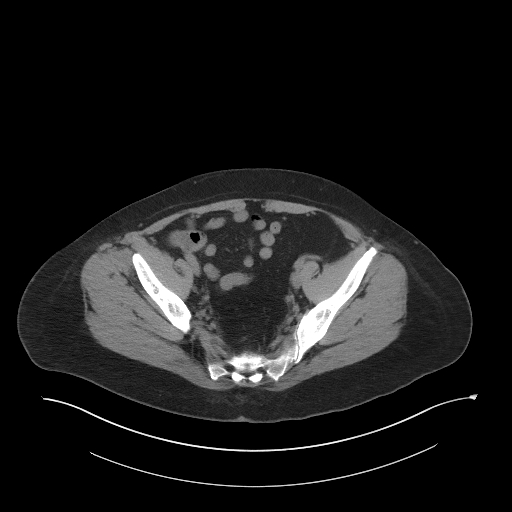
[im 31/92  soft-tissue]
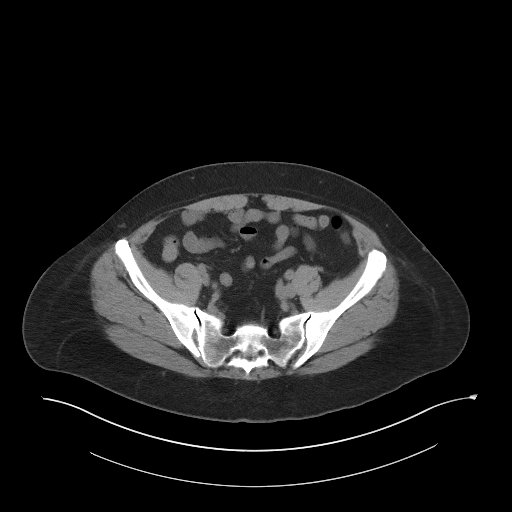
[im 38/92  soft-tissue]
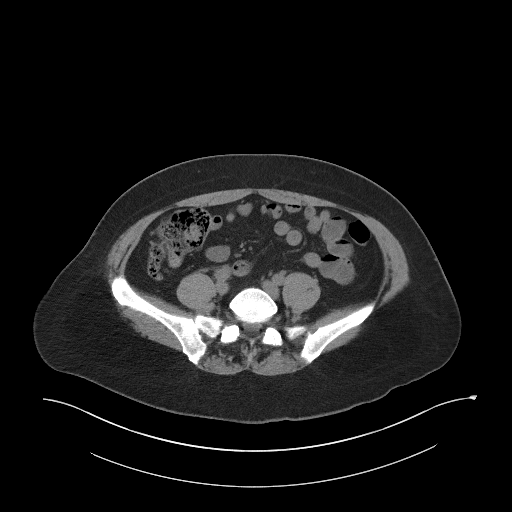
[im 46/92  soft-tissue]
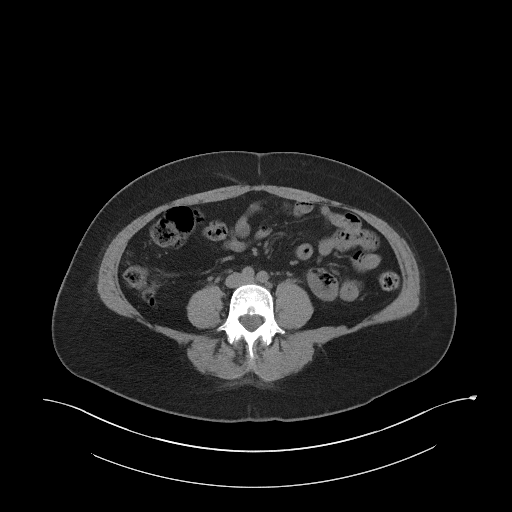
[im 54/92  soft-tissue]
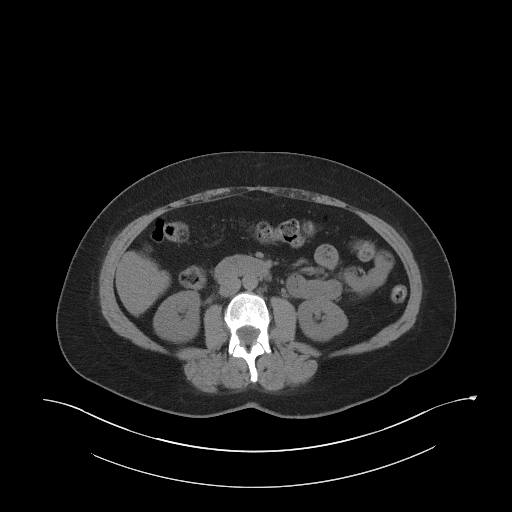
[im 61/92  soft-tissue]
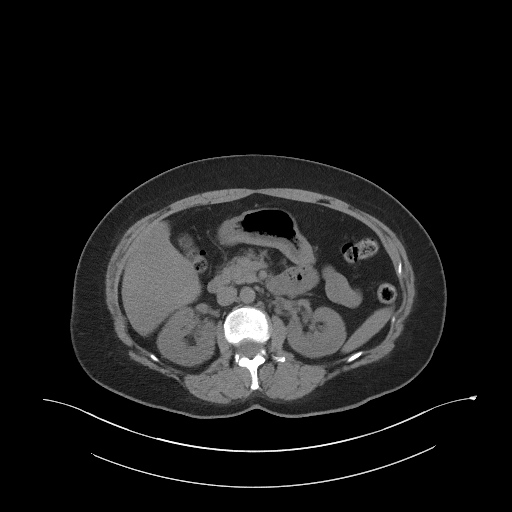
[im 61/92  bone]
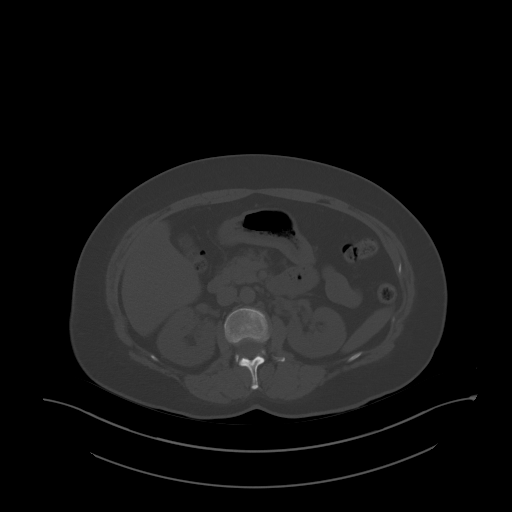
[im 65/92  soft-tissue]
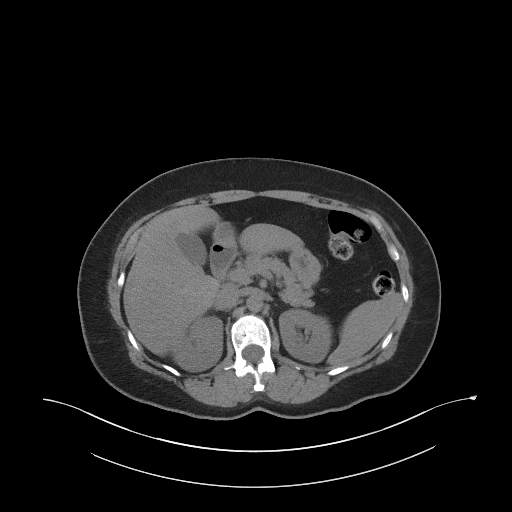
[im 73/92  soft-tissue]
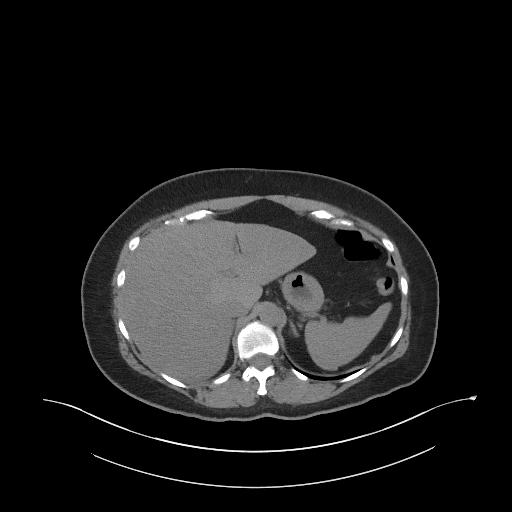
[im 80/92  soft-tissue]
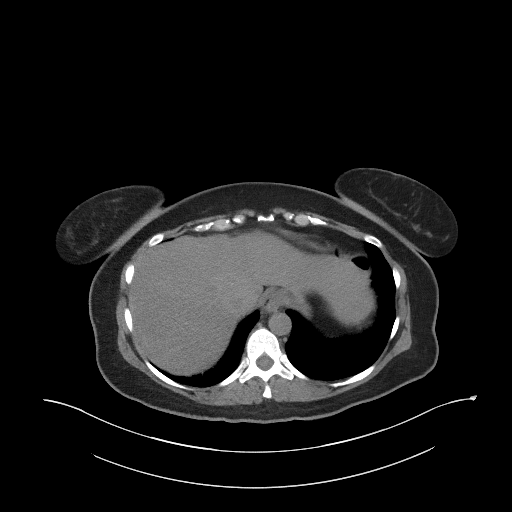
[im 88/92  soft-tissue]
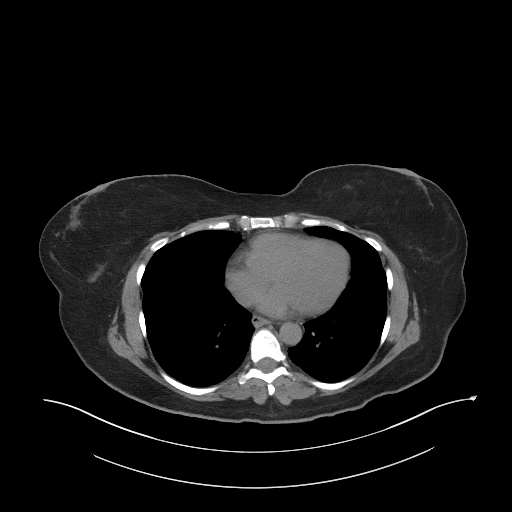

[Series 5: coronal st · coronal · 0.82mm/px · 3 of 107 slices shown]
[im 36/107  soft-tissue]
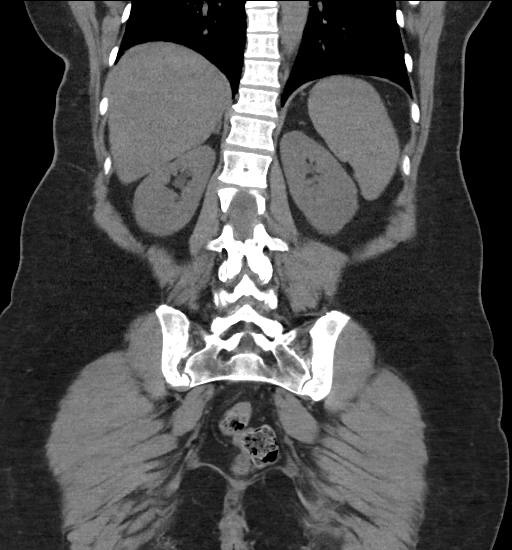
[im 48/107  soft-tissue]
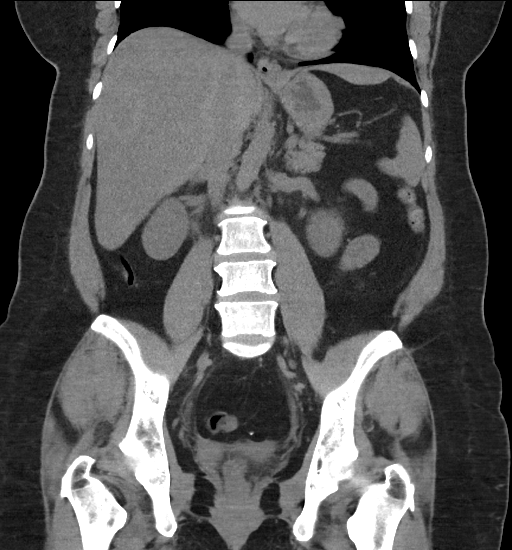
[im 59/107  soft-tissue]
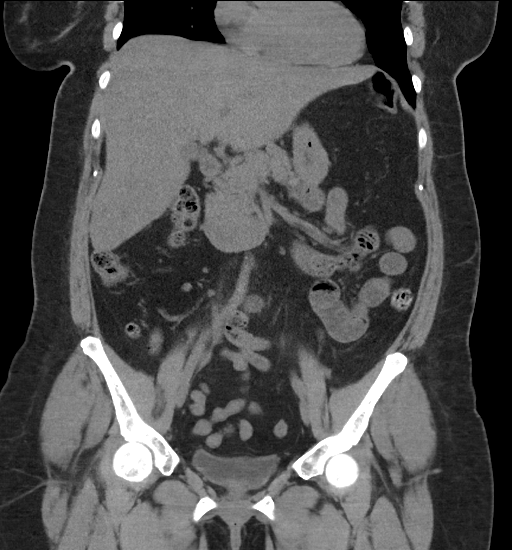

[16 of 46 positions shown; findings below may reference images not displayed]

FINDINGS: Lower chest: Clear lung bases.

Hepatobiliary: Liver normal in size. Diffuse decreased attenuation
of the liver consistent with fatty infiltration. No mass. Normal
gallbladder. No bile duct dilation.

Pancreas: Unremarkable. No pancreatic ductal dilatation or
surrounding inflammatory changes.

Spleen: Normal in size without focal abnormality.

Adrenals/Urinary Tract: Normal adrenal glands. Kidneys normal in
size, orientation and position. No renal masses. No stones. No
hydronephrosis. Subtle stranding adjacent to the right renal pelvis
and proximal right ureter. Additional mild stranding noted along the
course of the right ureter. Mild right ureteral prominence. No
ureteral stone. Normal left ureter. Normal bladder.

Stomach/Bowel: Stomach is within normal limits. Appendix appears
normal. No evidence of bowel wall thickening, distention, or
inflammatory changes.

Vascular/Lymphatic: Mild aortic atherosclerosis. No aneurysm. No
enlarged lymph nodes.

Reproductive: Status post hysterectomy. No adnexal masses.

Other: No abdominal wall hernia or abnormality. No abdominopelvic
ascites.

Musculoskeletal: No acute or significant osseous findings.
IMPRESSION: 1. Mild stranding adjacent to the right renal pelvis and prominent
right ureter, without evidence of a ureteral stone. Findings
consistent with a recently passed stone. No bladder stone.
2. No other evidence of an acute abnormality within the abdomen or
pelvis. No intrarenal stones.
3. Hepatic steatosis.
4. Aortic atherosclerosis.
